# Patient Record
Sex: Female | Born: 1985 | Race: White | Hispanic: No | Smoking: Current every day smoker
Health system: Southern US, Community
[De-identification: ages and names within clinical notes are randomized; demographics above are authoritative.]

## PROBLEM LIST (undated history)

## (undated) ENCOUNTER — Inpatient Hospital Stay (HOSPITAL_COMMUNITY): Payer: Self-pay

## (undated) DIAGNOSIS — T4145XA Adverse effect of unspecified anesthetic, initial encounter: Secondary | ICD-10-CM

## (undated) DIAGNOSIS — IMO0002 Reserved for concepts with insufficient information to code with codable children: Secondary | ICD-10-CM

## (undated) DIAGNOSIS — R51 Headache: Secondary | ICD-10-CM

## (undated) HISTORY — PX: OTHER SURGICAL HISTORY: SHX169

---

## 2011-03-04 ENCOUNTER — Inpatient Hospital Stay (INDEPENDENT_AMBULATORY_CARE_PROVIDER_SITE_OTHER)
Admission: RE | Admit: 2011-03-04 | Discharge: 2011-03-04 | Disposition: A | Discharge status: Home / Self Care | Payer: Self-pay | Source: Ambulatory Visit | Attending: Family Medicine | Admitting: Family Medicine

## 2011-03-04 ENCOUNTER — Ambulatory Visit (INDEPENDENT_AMBULATORY_CARE_PROVIDER_SITE_OTHER): Payer: Self-pay

## 2011-03-04 DIAGNOSIS — M549 Dorsalgia, unspecified: Secondary | ICD-10-CM

## 2011-03-04 DIAGNOSIS — S139XXA Sprain of joints and ligaments of unspecified parts of neck, initial encounter: Secondary | ICD-10-CM

## 2012-03-26 ENCOUNTER — Encounter (HOSPITAL_COMMUNITY): Payer: Self-pay | Admitting: *Deleted

## 2012-03-26 ENCOUNTER — Inpatient Hospital Stay (HOSPITAL_COMMUNITY)
Admission: AD | Admit: 2012-03-26 | Discharge: 2012-03-26 | Disposition: A | Discharge status: Home / Self Care | Payer: Medicaid Other | Source: Ambulatory Visit | Attending: Obstetrics & Gynecology | Admitting: Obstetrics & Gynecology

## 2012-03-26 DIAGNOSIS — O219 Vomiting of pregnancy, unspecified: Secondary | ICD-10-CM

## 2012-03-26 DIAGNOSIS — O210 Mild hyperemesis gravidarum: Secondary | ICD-10-CM | POA: Insufficient documentation

## 2012-03-26 HISTORY — DX: Reserved for concepts with insufficient information to code with codable children: IMO0002

## 2012-03-26 HISTORY — DX: Headache: R51

## 2012-03-26 LAB — URINALYSIS, ROUTINE W REFLEX MICROSCOPIC
Glucose, UA: NEGATIVE mg/dL
Leukocytes, UA: NEGATIVE
Nitrite: NEGATIVE
Protein, ur: NEGATIVE mg/dL

## 2012-03-26 LAB — POCT PREGNANCY, URINE: Preg Test, Ur: POSITIVE — AB

## 2012-03-26 MED ORDER — PROMETHAZINE HCL 25 MG RE SUPP: 25.0000 mg | Freq: Four times a day (QID) | RECTAL | Status: DC | PRN

## 2012-03-26 MED ORDER — ONDANSETRON 8 MG PO TBDP
8.0000 mg | ORAL_TABLET | Freq: Once | ORAL | Status: AC
  Administered 2012-03-26: 8 mg via ORAL
  Filled 2012-03-26: qty 1

## 2012-03-26 MED ORDER — ACETAMINOPHEN 325 MG PO TABS
650.0000 mg | ORAL_TABLET | Freq: Once | ORAL | Status: AC
  Administered 2012-03-26: 650 mg via ORAL
  Filled 2012-03-26: qty 2

## 2012-03-26 MED ORDER — ONDANSETRON 8 MG PO TBDP: 8.0000 mg | ORAL_TABLET | Freq: Once | ORAL | Status: AC

## 2012-03-26 NOTE — MAU Note (Signed)
Pt also C/O migraine HA x 3-4 days.

## 2012-03-26 NOTE — MAU Provider Note (Signed)
History     CSN: [id number]  Arrival date and time: 03/26/12 4696   None     Chief Complaint  Patient presents with  . Emesis   HPI Alexis Duarte is 26 y.o. G3P1101 [redacted]w[redacted]d weeks presenting with symptoms of "excessive" nausea and vomiting.  Hx of morning sickness with previous pregnancies but sxs more than before.  Has appt to begin prenatal care with Dr. Arlyce Dice next week.  With the nausea/vomiting she has a headache off and on for 4 days.  Tylenol X 2, eases pain. Doesn't have medication at home for nausea.  Denies vaginal bleeding.  Having cramping on the left side.  Having loose stools since yesterday.  Began at the same time as vomiting.  No others at home are sick.  Patient states she had a stillborn at 8 months gestations and she is worried about this pregnancy.    Past Medical History  Diagnosis Date  . Headache   . Fetal demise     Past Surgical History  Procedure Date  . No past surgeries     History reviewed. No pertinent family history.  History  Substance Use Topics  . Smoking status: Current Everyday Smoker    Types: Cigarettes  . Smokeless tobacco: Not on file  . Alcohol Use: No    Allergies:  Allergies  Allergen Reactions  . Zithromax (Azithromycin) Nausea And Vomiting    Prescriptions prior to admission  Medication Sig Dispense Refill  . acetaminophen (TYLENOL) 500 MG tablet Take 1,000 mg by mouth every 8 (eight) hours as needed. For pain/headache      . calcium carbonate (TUMS - DOSED IN MG ELEMENTAL CALCIUM) 500 MG chewable tablet Chew 1 tablet by mouth daily as needed. For heartburn      . Prenatal Vit-Fe Fumarate-FA (PRENATAL MULTIVITAMIN) TABS Take 1 tablet by mouth every morning.        Review of Systems  Constitutional: Positive for fever (feels hot sometimes).  Gastrointestinal: Positive for nausea, vomiting, abdominal pain (mild cramping intermittent) and diarrhea. Negative for constipation.  Genitourinary: Negative for dysuria, urgency and  hematuria.       Negative for vaginal bleeding or abnormal discharge  Neurological: Positive for headaches.   Physical Exam   Blood pressure 113/92, pulse 95, temperature 97 F (36.1 C), temperature source Oral, resp. rate 16, last menstrual period 01/22/2012.  Physical Exam  Constitutional: She is oriented to person, place, and time. She appears well-developed and well-nourished. No distress.       Worried about the pregnancy  HENT:  Head: Normocephalic.  Neck: Normal range of motion.  Cardiovascular: Normal rate.   Respiratory: Effort normal.  GI: Soft. She exhibits no distension and no mass. There is no tenderness. There is no rebound and no guarding.  Genitourinary:       Not indicated  Neurological: She is alert and oriented to person, place, and time.  Skin: Skin is warm and dry.  Psychiatric: She has a normal mood and affect. Her behavior is normal.   Results for orders placed during the hospital encounter of 03/26/12 (from the past 24 hour(s))  URINALYSIS, ROUTINE W REFLEX MICROSCOPIC     Status: Normal   Collection Time   03/26/12  8:15 AM      Component Value Range   Color, Urine YELLOW  YELLOW   APPearance CLEAR  CLEAR   Specific Gravity, Urine 1.015  1.005 - 1.030   pH 7.0  5.0 - 8.0   Glucose,  UA NEGATIVE  NEGATIVE mg/dL   Hgb urine dipstick NEGATIVE  NEGATIVE   Bilirubin Urine NEGATIVE  NEGATIVE   Ketones, ur NEGATIVE  NEGATIVE mg/dL   Protein, ur NEGATIVE  NEGATIVE mg/dL   Urobilinogen, UA 0.2  0.0 - 1.0 mg/dL   Nitrite NEGATIVE  NEGATIVE   Leukocytes, UA NEGATIVE  NEGATIVE  POCT PREGNANCY, URINE     Status: Abnormal   Collection Time   03/26/12  8:19 AM      Component Value Range   Preg Test, Ur POSITIVE (*) NEGATIVE   MAU Course  Procedures   BEDSIDE ULTRASOUND:  + FHR estimated size 9 weeks. MDM Acetaminophen 650 mg po and Zofran 8mg  ODT subling.given  Discussed medications to be taken at home.  Patient would like phenergan for nighttime [place] and  Zofran for the day because she has to work at 5:30 am.  Need work note for today. Assessment and Plan  A:  Nausea and vomiting in early pregnancy      Viable embryo by bedside ultrasound  P:  Rx for Phenergan 25mg  suppoistories for hs use       Rx for Zofran 8mg  OTC for day time use     Keep appointment with Dr. Arlyce Dice to begin prenatal care.  KEY,EVE M 03/26/2012, 8:57 AM

## 2012-03-26 NOTE — MAU Note (Signed)
Pt C/O excessive vomitting for past 2 days, chills, diarrhea also x 3 days.  LLQ cramping for last 2-3 days, denies vaginal bleeding.

## 2012-04-03 LAB — OB RESULTS CONSOLE HEPATITIS B SURFACE ANTIGEN: Hepatitis B Surface Ag: NEGATIVE

## 2012-04-03 LAB — OB RESULTS CONSOLE HIV ANTIBODY (ROUTINE TESTING): HIV: NONREACTIVE

## 2012-04-23 LAB — OB RESULTS CONSOLE RUBELLA ANTIBODY, IGM: Rubella: IMMUNE

## 2012-06-02 ENCOUNTER — Encounter (HOSPITAL_COMMUNITY): Payer: Self-pay | Admitting: Obstetrics and Gynecology

## 2012-06-02 ENCOUNTER — Inpatient Hospital Stay (HOSPITAL_COMMUNITY)
Admission: AD | Admit: 2012-06-02 | Discharge: 2012-06-02 | Disposition: A | Discharge status: Home / Self Care | Payer: Medicaid Other | Source: Ambulatory Visit | Attending: Obstetrics and Gynecology | Admitting: Obstetrics and Gynecology

## 2012-06-02 DIAGNOSIS — R42 Dizziness and giddiness: Secondary | ICD-10-CM

## 2012-06-02 DIAGNOSIS — O99891 Other specified diseases and conditions complicating pregnancy: Secondary | ICD-10-CM | POA: Insufficient documentation

## 2012-06-02 DIAGNOSIS — Z3490 Encounter for supervision of normal pregnancy, unspecified, unspecified trimester: Secondary | ICD-10-CM

## 2012-06-02 LAB — COMPREHENSIVE METABOLIC PANEL
ALT: 12 U/L (ref 0–35)
AST: 13 U/L (ref 0–37)
Albumin: 3.4 g/dL — ABNORMAL LOW (ref 3.5–5.2)
Alkaline Phosphatase: 43 U/L (ref 39–117)
Calcium: 9.3 mg/dL (ref 8.4–10.5)
Potassium: 4.1 mEq/L (ref 3.5–5.1)
Sodium: 135 mEq/L (ref 135–145)
Total Protein: 7 g/dL (ref 6.0–8.3)

## 2012-06-02 LAB — URINALYSIS, ROUTINE W REFLEX MICROSCOPIC
Bilirubin Urine: NEGATIVE
Glucose, UA: NEGATIVE mg/dL
Ketones, ur: NEGATIVE mg/dL
Specific Gravity, Urine: <1.005 — ABNORMAL LOW (ref 1.005–1.030)
pH: 7 (ref 5.0–8.0)

## 2012-06-02 LAB — CBC
Hemoglobin: 11.3 g/dL — ABNORMAL LOW (ref 12.0–15.0)
MCHC: 33.9 g/dL (ref 30.0–36.0)
Platelets: 202 10*3/uL (ref 150–400)
RDW: 12.6 % (ref 11.5–15.5)

## 2012-06-02 NOTE — MAU Provider Note (Signed)
History     CSN: [id number]  Arrival date and time: 06/02/12 6213   First Provider Initiated Contact with Patient 06/02/12 [phone]      Chief Complaint  Patient presents with  . Dizziness  . Tinnitus   HPI 26 y.o. G3P1101 at [redacted]w[redacted]d with dizziness and ringing in ears intermittently since Thursday. Happened around 6:45 this morning. Feels hot, ears ring, legs feel week, feels "like my head's full of cotton" afterwards, headache (2/10). Had apple cinnamon oatmeal and juice this morning. No syncopal episodes.    Past Medical History  Diagnosis Date  . Headache   . Fetal demise     Past Surgical History  Procedure Date  . Stitches to head as a child     History reviewed. No pertinent family history.  History  Substance Use Topics  . Smoking status: Current Every Day Smoker    Types: Cigarettes  . Smokeless tobacco: Not on file  . Alcohol Use: No    Allergies:  Allergies  Allergen Reactions  . Zithromax (Azithromycin) Nausea And Vomiting    Prescriptions prior to admission  Medication Sig Dispense Refill  . acetaminophen (TYLENOL) 500 MG tablet Take 1,000 mg by mouth every 8 (eight) hours as needed. For pain/headache      . calcium carbonate (TUMS - DOSED IN MG ELEMENTAL CALCIUM) 500 MG chewable tablet Chew 1 tablet by mouth daily as needed. For heartburn      . ondansetron (ZOFRAN-ODT) 8 MG disintegrating tablet Take 8 mg by mouth every 8 (eight) hours as needed. nausea      . Prenatal Vit-Fe Fumarate-FA (PRENATAL MULTIVITAMIN) TABS Take 1 tablet by mouth at bedtime.         Review of Systems  Respiratory: Negative.   Cardiovascular: Negative.   Gastrointestinal: Negative for nausea, vomiting, abdominal pain, diarrhea and constipation.  Genitourinary: Negative for dysuria, urgency, frequency, hematuria and flank pain.       Negative for vaginal bleeding, cramping/contractions  Musculoskeletal: Negative.   Neurological: Positive for dizziness, weakness and  headaches.  Psychiatric/Behavioral: Negative.    Physical Exam   Blood pressure 123/88, pulse 93, temperature 98 F (36.7 C), temperature source Oral, resp. rate 18, height 5' 3.5" (1.613 m), weight 131 lb 3.2 oz (59.512 kg), last menstrual period 01/22/2012.  Physical Exam  Nursing note and vitals reviewed. Constitutional: She is oriented to person, place, and time. She appears well-developed and well-nourished. No distress.  HENT:  Head: Normocephalic and atraumatic.  Right Ear: Tympanic membrane and external ear normal.  Left Ear: Tympanic membrane and external ear normal.  Eyes: Conjunctivae normal and EOM are normal. Pupils are equal, round, and reactive to light.  Cardiovascular: Normal rate, regular rhythm and normal heart sounds.   Respiratory: Effort normal and breath sounds normal. No respiratory distress.  GI: Soft. There is no tenderness.  Musculoskeletal: Normal range of motion.  Neurological: She is alert and oriented to person, place, and time. No cranial nerve deficit. Coordination normal.  Skin: Skin is warm and dry.  Psychiatric: She has a normal mood and affect. Her behavior is normal.    MAU Course  Procedures Results for orders placed during the hospital encounter of 06/02/12 (from the past 24 hour(s))  URINALYSIS, ROUTINE W REFLEX MICROSCOPIC     Status: Abnormal   Collection Time   06/02/12  9:05 AM      Component Value Range   Color, Urine YELLOW  YELLOW   APPearance CLEAR  CLEAR  Specific Gravity, Urine <1.005 (*) 1.005 - 1.030   pH 7.0  5.0 - 8.0   Glucose, UA NEGATIVE  NEGATIVE mg/dL   Hgb urine dipstick NEGATIVE  NEGATIVE   Bilirubin Urine NEGATIVE  NEGATIVE   Ketones, ur NEGATIVE  NEGATIVE mg/dL   Protein, ur NEGATIVE  NEGATIVE mg/dL   Urobilinogen, UA 0.2  0.0 - 1.0 mg/dL   Nitrite NEGATIVE  NEGATIVE   Leukocytes, UA NEGATIVE  NEGATIVE  CBC     Status: Abnormal   Collection Time   06/02/12  9:20 AM      Component Value Range   WBC 10.3   4.0 - 10.5 K/uL   RBC 3.62 (*) 3.87 - 5.11 MIL/uL   Hemoglobin 11.3 (*) 12.0 - 15.0 g/dL   HCT 16.1 (*) 09.6 - 04.5 %   MCV 92.0  78.0 - 100.0 fL   MCH 31.2  26.0 - 34.0 pg   MCHC 33.9  30.0 - 36.0 g/dL   RDW 40.9  81.1 - 91.4 %   Platelets 202  150 - 400 K/uL  COMPREHENSIVE METABOLIC PANEL     Status: Abnormal   Collection Time   06/02/12  9:20 AM      Component Value Range   Sodium 135  135 - 145 mEq/L   Potassium 4.1  3.5 - 5.1 mEq/L   Chloride 101  96 - 112 mEq/L   CO2 24  19 - 32 mEq/L   Glucose, Bld 80  70 - 99 mg/dL   BUN 5 (*) 6 - 23 mg/dL   Creatinine, Ser 7.82 (*) 0.50 - 1.10 mg/dL   Calcium 9.3  8.4 - 95.6 mg/dL   Total Protein 7.0  6.0 - 8.3 g/dL   Albumin 3.4 (*) 3.5 - 5.2 g/dL   AST 13  0 - 37 U/L   ALT 12  0 - 35 U/L   Alkaline Phosphatase 43  39 - 117 U/L   Total Bilirubin 0.3  0.3 - 1.2 mg/dL   GFR calc non Af Amer >90  >90 mL/min   GFR calc Af Amer >90  >90 mL/min     Assessment and Plan   1. Episodic lightheadedness   2. Supervision of normal pregnancy    Increase frequency of meals, protein, decrease sugar F/U in office if symptoms worsen or continue despite lifestyle changes    Medication List     As of 06/02/2012  3:18 PM    CONTINUE taking these medications         acetaminophen 500 MG tablet   Commonly known as: TYLENOL      calcium carbonate 500 MG chewable tablet   Commonly known as: TUMS - dosed in mg elemental calcium      ondansetron 8 MG disintegrating tablet   Commonly known as: ZOFRAN-ODT      prenatal multivitamin Tabs            Follow-up Information    Follow up with Mickel Baas, MD. (as scheduled or sooner as needed)    Contact information:   [address] [place] [address] [phone]            FRAZIER,NATALIE 06/02/2012, 9:32 AM

## 2012-06-02 NOTE — MAU Note (Signed)
"  Thursday I was at work and I thought I got to hot.  My ears started ringing, then dizzy x 2 episodes.  My head feels congested and I can't concentrate.  No vomiting.  It happened again this morning about 0645.  I felt dizzy and lightheaded and fell backwards on my butt.  It happened once again and I got sweaty.  I laid down and caught my breath."

## 2012-07-13 ENCOUNTER — Inpatient Hospital Stay (HOSPITAL_COMMUNITY): Payer: Medicaid Other

## 2012-07-13 ENCOUNTER — Encounter (HOSPITAL_COMMUNITY): Payer: Self-pay | Admitting: Anesthesiology

## 2012-07-13 ENCOUNTER — Inpatient Hospital Stay (HOSPITAL_COMMUNITY)
Admission: AD | Admit: 2012-07-13 | Discharge: 2012-07-18 | DRG: 782 | Discharge status: Against Medical Advice | Payer: Medicaid Other | Source: Ambulatory Visit | Attending: Obstetrics and Gynecology | Admitting: Obstetrics and Gynecology

## 2012-07-13 ENCOUNTER — Encounter (HOSPITAL_COMMUNITY): Payer: Self-pay | Admitting: *Deleted

## 2012-07-13 DIAGNOSIS — T8859XA Other complications of anesthesia, initial encounter: Secondary | ICD-10-CM

## 2012-07-13 DIAGNOSIS — O4590 Premature separation of placenta, unspecified, unspecified trimester: Principal | ICD-10-CM | POA: Diagnosis present

## 2012-07-13 DIAGNOSIS — R109 Unspecified abdominal pain: Secondary | ICD-10-CM | POA: Diagnosis present

## 2012-07-13 HISTORY — DX: Other complications of anesthesia, initial encounter: T88.59XA

## 2012-07-13 HISTORY — DX: Adverse effect of unspecified anesthetic, initial encounter: T41.45XA

## 2012-07-13 LAB — COMPREHENSIVE METABOLIC PANEL
ALT: 9 U/L (ref 0–35)
Alkaline Phosphatase: 56 U/L (ref 39–117)
BUN: 8 mg/dL (ref 6–23)
CO2: 22 mEq/L (ref 19–32)
Calcium: 9.8 mg/dL (ref 8.4–10.5)
GFR calc Af Amer: >90 mL/min (ref >90)
GFR calc non Af Amer: >90 mL/min (ref >90)
Glucose, Bld: 87 mg/dL (ref 70–99)
Potassium: 3.3 mEq/L — ABNORMAL LOW (ref 3.5–5.1)
Total Protein: 7 g/dL (ref 6.0–8.3)

## 2012-07-13 LAB — ABO/RH: ABO/RH(D): O POS

## 2012-07-13 LAB — CBC
HCT: 31 % — ABNORMAL LOW (ref 36.0–46.0)
Hemoglobin: 11 g/dL — ABNORMAL LOW (ref 12.0–15.0)
MCH: 32.5 pg (ref 26.0–34.0)
MCH: 31.4 pg (ref 26.0–34.0)
MCHC: 35.5 g/dL (ref 30.0–36.0)
MCHC: 33.8 g/dL (ref 30.0–36.0)
Platelets: 229 10*3/uL (ref 150–400)
RBC: 3.38 MIL/uL — ABNORMAL LOW (ref 3.87–5.11)

## 2012-07-13 LAB — PROTIME-INR: INR: 0.91 (ref 0.00–1.49)

## 2012-07-13 LAB — APTT: aPTT: 28 seconds (ref 24–37)

## 2012-07-13 LAB — PREPARE RBC (CROSSMATCH)

## 2012-07-13 MED ORDER — BETAMETHASONE SOD PHOS & ACET 6 (3-3) MG/ML IJ SUSP
12.0000 mg | INTRAMUSCULAR | Status: AC
  Administered 2012-07-13 – 2012-07-14 (×2): 12 mg via INTRAMUSCULAR
  Filled 2012-07-13 (×2): qty 2

## 2012-07-13 MED ORDER — MAGNESIUM SULFATE 40 G IN LACTATED RINGERS - SIMPLE: 2.0000 g/h | INTRAVENOUS | Status: DC

## 2012-07-13 MED ORDER — PRENATAL MULTIVITAMIN CH
1.0000 | ORAL_TABLET | Freq: Every day | ORAL | Status: DC
  Administered 2012-07-14: 1 via ORAL
  Filled 2012-07-13: qty 1

## 2012-07-13 MED ORDER — MAGNESIUM SULFATE 40 G IN LACTATED RINGERS - SIMPLE
2.0000 g/h | INTRAVENOUS | Status: DC
  Administered 2012-07-13 – 2012-07-15 (×3): 2 g/h via INTRAVENOUS
  Filled 2012-07-13 (×3): qty 500

## 2012-07-13 MED ORDER — ACETAMINOPHEN 325 MG PO TABS
650.0000 mg | ORAL_TABLET | ORAL | Status: DC | PRN
  Administered 2012-07-15 – 2012-07-17 (×3): 650 mg via ORAL
  Filled 2012-07-13: qty 1
  Filled 2012-07-13: qty 2
  Filled 2012-07-13: qty 1
  Filled 2012-07-13: qty 2

## 2012-07-13 MED ORDER — MAGNESIUM SULFATE BOLUS VIA INFUSION: 4.0000 g | Freq: Once | INTRAVENOUS | Status: DC

## 2012-07-13 MED ORDER — ZOLPIDEM TARTRATE 5 MG PO TABS: 5.0000 mg | ORAL_TABLET | Freq: Every evening | ORAL | Status: DC | PRN

## 2012-07-13 MED ORDER — CALCIUM CARBONATE ANTACID 500 MG PO CHEW: 2.0000 | CHEWABLE_TABLET | ORAL | Status: DC | PRN

## 2012-07-13 MED ORDER — MAGNESIUM SULFATE BOLUS VIA INFUSION
4.0000 g | Freq: Once | INTRAVENOUS | Status: AC
  Administered 2012-07-13: 4 g via INTRAVENOUS
  Filled 2012-07-13: qty 500

## 2012-07-13 MED ORDER — LACTATED RINGERS IV SOLN
INTRAVENOUS | Status: DC
  Administered 2012-07-13 – 2012-07-15 (×5): via INTRAVENOUS

## 2012-07-13 MED ORDER — MAGNESIUM SULFATE 40 G IN LACTATED RINGERS - SIMPLE: 2.0000 g/h | Freq: Once | INTRAVENOUS | Status: DC

## 2012-07-13 MED ORDER — DOCUSATE SODIUM 100 MG PO CAPS
100.0000 mg | ORAL_CAPSULE | Freq: Every day | ORAL | Status: DC
  Administered 2012-07-14 – 2012-07-16 (×3): 100 mg via ORAL
  Filled 2012-07-13 (×4): qty 1

## 2012-07-13 MED ORDER — SODIUM CHLORIDE 0.9 % IV SOLN
INTRAVENOUS | Status: DC
  Administered 2012-07-13: 17:00:00 via INTRAVENOUS

## 2012-07-13 NOTE — Progress Notes (Signed)
uc's q 2-3 minutes until 1813, now appears to be ui.

## 2012-07-13 NOTE — H&P (Addendum)
Alexis Duarte is a 26 y.o. female presenting for cramping and bleeding  26yo G3P1101 @ 23+3 presents to MAU c/o heavy vaginal bleeding and cramping. The patient earlier today tripped and fell at work around 1pm.  She landed on her bottom and had no direct abdominal trauma. At 3pm she noted a gush of fluid and went to the bathroom and found bright red bleeding.  She soaked a pad en route and continued to have bright bleeding in MAU.  A bedside ultrasound was performed and demonstrated a large retroplacental abruption measuring 10 x 5 x 7 cm.  FHTs 150s and reactive for 23 weeks with only rare variables and no decelerations.  Cervix FT. Pt is contracting every 2-3 minutes but is not in severe pain.   The patient has a h/o of a prior pregnancy affected by placental abruption with a still birth in 2007 @ 36-37 weeks.  After this she had a full term SVD of a female infant.  This pregnancy has been complicated by tobacco abuse.  History OB History    Grav Para Term Preterm Abortions TAB SAB Ect Mult Living   3 2 1 1      1      Past Medical History  Diagnosis Date  . Headache   . Fetal demise    Past Surgical History  Procedure Date  . Stitches to head as a child    Family History: family history is not on file. Social History:  reports that she has been smoking Cigarettes.  She does not have any smokeless tobacco history on file. She reports that she does not drink alcohol or use illicit drugs.   Prenatal Transfer Tool  Maternal Diabetes: Glucola not yet done Genetic Screening: Normal Maternal Ultrasounds/Referrals: Normal Fetal Ultrasounds or other Referrals:  Fetal echo Maternal Substance Abuse:  Yes:  Type: Smoker Significant Maternal Medications:  None Significant Maternal Lab Results:  None Other Comments:  None  ROS: as above  Dilation: Fingertip Effacement (%): Thick Station: -2 Exam by:: Ivonne Andrew CNM Blood pressure 115/77, pulse 90, temperature 98 F (36.7 C),  temperature source Oral, resp. rate 18, height 5\' 3"  (1.6 m), weight 61.236 kg (135 lb), last menstrual period 01/22/2012. Exam Physical Exam  Prenatal labs: ABO, Rh: --/--/O POS, O POS (12/06 1655) Antibody: NEG (12/06 1655) Rubella:   RPR:   NR HBsAg:   Neg HIV:   NR GBS:   Not done  TAUS: Anterior placenta, 10.7 x 5 x 7 cm retroplacental clot.  Fetus cephalic. Cervical length 3.6cm  Assessment/Plan: 26 yo G3P1101 @ 23+3 with a partial placental abruption 1) Admit 2) Strict bedrest, place foley catheter 3) Dorathy Kinsman, CNM discussed pts case with Dr. Claudean Severance from MFM.  He recommended giving betamethasone for FLM.  He felt tocolysis was a judgement call.  Given the gestational age and the reassuring fetal tracing will proceed with magnesium tocolysis to try to get patient through steroid time.  4) Clear liquids only for now. 5) CBC Q 6 hrs x 24 hrs. Check coags with next blood draw 6) T&S, KB pending 7) SCDs for DVT prophylaxis   Jahkeem Kurka H. 07/13/2012, 7:03 PM

## 2012-07-13 NOTE — Consult Note (Addendum)
Neonatology Consult Note: At the request of the patients obstetrician Dr. Tenny Craw I met with Alexis Duarte emergently, who presents at 23 3 wks (dated by a 9 week [place]) with abruption.  Receiving BMZ at the time of this consult.  Delivery plan ongoing.      We discussed morbidity/mortality at this gestional age (25% survival rate at this gestational age nationally), delivery room resuscitation, including intubation and surfactant in DR.  Discussed mechanical ventilation and risk for chronic lung disease, risk for IVH with potential for motor / cognitive deficits, ROP, NEC, sepsis, as well as temperature instability and feeding immaturity.  Discussed NG / OG feeds, benefits of MBM in reducing incidence of NEC.   Discussed likely length of stay. Parents are quite distressed at this time however indicate that they would like all resuscitative efforts to be made and  understand that the baby will be very sick with a significant mortality risk.      Thank you for allowing [place] to participate in her care.  Please call with questions. Face to face time 20 min.  Alexis Giovanni, DO  Neonatologist

## 2012-07-13 NOTE — MAU Note (Signed)
Patient states she fell at 1245 on her hands and knees but did not hit her abdomen. About 1500 she started bleeding. Pad patient is wearing on arrival is 3/4 saturated with dark red blood and patient is in a lot of pain with lower abdominal pain. States she has felt the baby move.

## 2012-07-13 NOTE — MAU Provider Note (Signed)
Chief Complaint:  Abdominal Pain, Vaginal Bleeding and Fall   First Provider Initiated Contact with Patient 07/13/12 1751     HPI: Alexis Duarte is a 26 y.o. G3P1101 at [redacted]w[redacted]d who presents to maternity admissions reporting minor fall onto knees w/out abd contact at 1245 and onset of ?contractions and moderate bright red bleeding at 1500. Unsure if she is feeling fetal mvmt. Hx of abruption and stillbirth w/ G1  Past Medical History: Past Medical History  Diagnosis Date  . Headache   . Fetal demise     Past obstetric history: OB History    Grav Para Term Preterm Abortions TAB SAB Ect Mult Living   3 2 1 1      1      # Outc Date GA Lbr Len/2nd Wgt Sex Del Anes PTL Lv   1 PRE 2/07 [redacted]w[redacted]d   M SVD   SB   2 TRM 12/07 [redacted]w[redacted]d   F    Yes   3 CUR               Past Surgical History: Past Surgical History  Procedure Date  . Stitches to head as a child     Family History: No family history on file.  Social History: History  Substance Use Topics  . Smoking status: Current Every Day Smoker    Types: Cigarettes  . Smokeless tobacco: Not on file  . Alcohol Use: No    Allergies:  Allergies  Allergen Reactions  . Zithromax (Azithromycin) Nausea And Vomiting    Meds:  Prescriptions prior to admission  Medication Sig Dispense Refill  . acetaminophen (TYLENOL) 500 MG tablet Take 1,000 mg by mouth every 8 (eight) hours as needed. For pain/headache      . ondansetron (ZOFRAN-ODT) 8 MG disintegrating tablet Take 8 mg by mouth every 8 (eight) hours as needed. nausea      . Prenatal Vit-Fe Fumarate-FA (PRENATAL MULTIVITAMIN) TABS Take 1 tablet by mouth at bedtime.         ROS: Pertinent findings in history of present illness.  Physical Exam  Blood pressure 125/77, pulse 102, temperature 97.2 F (36.2 C), temperature source Oral, resp. rate 22, last menstrual period 01/22/2012. GENERAL: Well-developed, well-nourished female in severe distress.  HEENT: normocephalic HEART: mild  tachycardia RESP: normal effort ABDOMEN: Soft, non-tender,  gravid appropriate for gestational age. Uterus rigid.  EXTREMITIES: Nontender, no edema NEURO: alert and oriented SPECULUM EXAM: deferred. Small amount of DRB on pad. Moderate DRB on glove after cervical exam. Dilation: Fingertip Effacement (%): Thick Cervical Position: Anterior Station: -2 Exam by:: Ivonne Andrew CNM  FHT:  Baseline 160 , moderate variability, no accelerations present, mild variable decelerations Contractions: q 2-3 mins, mild   Labs: Results for orders placed during the hospital encounter of 07/13/12 (from the past 24 hour(s))  CBC     Status: Abnormal   Collection Time   07/13/12  4:55 PM      Component Value Range   WBC 13.9 (*) 4.0 - 10.5 K/uL   RBC 3.38 (*) 3.87 - 5.11 MIL/uL   Hemoglobin 11.0 (*) 12.0 - 15.0 g/dL   HCT 96.0 (*) 45.4 - 09.8 %   MCV 91.7  78.0 - 100.0 fL   MCH 32.5  26.0 - 34.0 pg   MCHC 35.5  30.0 - 36.0 g/dL   RDW 11.9  14.7 - 82.9 %   Platelets 230  150 - 400 K/uL  COMPREHENSIVE METABOLIC PANEL     Status:  Abnormal   Collection Time   07/13/12  4:55 PM      Component Value Range   Sodium 134 (*) 135 - 145 mEq/L   Potassium 3.3 (*) 3.5 - 5.1 mEq/L   Chloride 100  96 - 112 mEq/L   CO2 22  19 - 32 mEq/L   Glucose, Bld 87  70 - 99 mg/dL   BUN 8  6 - 23 mg/dL   Creatinine, Ser 4.01 (*) 0.50 - 1.10 mg/dL   Calcium 9.8  8.4 - 02.7 mg/dL   Total Protein 7.0  6.0 - 8.3 g/dL   Albumin 3.1 (*) 3.5 - 5.2 g/dL   AST 11  0 - 37 U/L   ALT 9  0 - 35 U/L   Alkaline Phosphatase 56  39 - 117 U/L   Total Bilirubin 0.3  0.3 - 1.2 mg/dL   GFR calc non Af Amer >90  >90 mL/min   GFR calc Af Amer >90  >90 mL/min  TYPE AND SCREEN     Status: Normal   Collection Time   07/13/12  4:55 PM      Component Value Range   ABO/RH(D) O POS     Antibody Screen NEG     Sample Expiration 07/16/2012    ABO/RH     Status: Normal   Collection Time   07/13/12  4:55 PM      Component Value Range   ABO/RH(D)  O POS    PREPARE RBC (CROSSMATCH)     Status: Normal   Collection Time   07/13/12  5:30 PM      Component Value Range   Order Confirmation ORDER PROCESSED BY BLOOD BANK      Imaging:    MAU Course: 1700: Notified Dr. Tenny Craw of pt Sx highly suspicious for abruption. Sonographer at [facility]. Dr. Tenny Craw in OR. Asked to consult MFM RE: administration of BMZ and Mag. Dr. Arville Lime recommends BMZ, Mag is up to provider, only 12 hours if given.   1749: Neonatologist at [facility]. BMZ #1 given. 1800: Dr. Tenny Craw at [facility] discussing POC. Assuming care of pt.   Assessment: Placental abruption  Plan: Admit to Antenatal.  Dorathy Kinsman, CNM 07/13/2012 5:23 PM

## 2012-07-14 LAB — CBC
Hemoglobin: 11 g/dL — ABNORMAL LOW (ref 12.0–15.0)
MCH: 31.9 pg (ref 26.0–34.0)
MCHC: 34.7 g/dL (ref 30.0–36.0)
MCV: 91.9 fL (ref 78.0–100.0)
Platelets: 245 10*3/uL (ref 150–400)
Platelets: 228 10*3/uL (ref 150–400)
RDW: 12.6 % (ref 11.5–15.5)
WBC: 14.5 10*3/uL — ABNORMAL HIGH (ref 4.0–10.5)

## 2012-07-14 MED ORDER — ONDANSETRON HCL 4 MG/2ML IJ SOLN
4.0000 mg | Freq: Four times a day (QID) | INTRAMUSCULAR | Status: DC | PRN
  Administered 2012-07-14 – 2012-07-18 (×4): 4 mg via INTRAVENOUS
  Filled 2012-07-14 (×4): qty 2

## 2012-07-14 MED ORDER — COMPLETENATE 29-1 MG PO CHEW
1.0000 | CHEWABLE_TABLET | Freq: Every day | ORAL | Status: DC
  Administered 2012-07-15 – 2012-07-18 (×4): 1 via ORAL
  Filled 2012-07-14 (×5): qty 1

## 2012-07-14 NOTE — Progress Notes (Signed)
1300- foley catheter d/c without difficulty. of clear yellow urine emptied.

## 2012-07-14 NOTE — Progress Notes (Signed)
Patient ID: Marlene Bast, female   DOB: 04/28/1986, 26 y.o.   MRN: [id number]   S: Denies feeling significant contractions. Only spotting of old blood on pad.  Uncomfortable with foley catheter. O: Filed Vitals:   07/14/12 1102 07/14/12 1157 07/14/12 1158 07/14/12 1301  BP:   110/74   Pulse:   86   Temp:  97.7 F (36.5 C)    TempSrc:  Oral    Resp: 20 20  20   Height:      Weight:       AOx3 NAD Soft NT ND FHT 150 reactive for 23 weeks with accelerations no decelerations. Cvx deferred toco irritability  CBC    Component Value Date/Time   WBC 16.3* 07/14/2012 1004   RBC 3.45* 07/14/2012 1004   HGB 11.0* 07/14/2012 1004   HCT 31.7* 07/14/2012 1004   PLT 228 07/14/2012 1004   MCV 91.9 07/14/2012 1004   MCH 31.9 07/14/2012 1004   MCHC 34.7 07/14/2012 1004   RDW 12.7 07/14/2012 1004    Coags WNL  A/P:  23+4 with large partial placental abruption 1) Cont Bedrest 2) Cont Steroids, steroid time complete 5pm tomorrow.  Continue Magnesium through steroid time 3) Continuous monitoring

## 2012-07-15 NOTE — Progress Notes (Addendum)
Patient ID: Alexis Duarte, female   DOB: 10-17-1985, 26 y.o.   MRN: [id number]  S: No changes, spotting on pad and with wiping.  Had an episode of vomiting this morning and has had more bright red spotting since then. OCeasar Mons Vitals:   07/15/12 0805 07/15/12 0902 07/15/12 0950 07/15/12 1106  BP: 105/74     Pulse: 82     Temp:      TempSrc:      Resp: 18 18 18 18   Height:      Weight:       AOx3 Soft NT FHT 140-150 reactive for 23 weeks.  No decels toco quiet  A/P 1) Continue strict bedrest.  May be able to advance to bathroom privleges in am.   2) Steroid time complete at 7pm.  Will stop magnesium at that point.

## 2012-07-16 ENCOUNTER — Encounter (HOSPITAL_COMMUNITY): Payer: Self-pay | Admitting: *Deleted

## 2012-07-16 NOTE — Consult Note (Signed)
[facility]  Neonatal Medicine Consultation       07/16/2012    12:39 PM  I was called at the request of the patient's nurse to update this patient and her husband.  Dr. Algernon Huxley spoke with them a couple of days ago.  I answered their questions as best as I could.  She remains between 23 and 24 weeks, but has improved since admission.  Mom says that she may be discharged in the next day or two.  _____________________ Electronically Signed By: Angelita Ingles, MD Neonatologist

## 2012-07-16 NOTE — Progress Notes (Signed)
Stable s/p partial abruption.  Bathroom privileges today.

## 2012-07-16 NOTE — Progress Notes (Signed)
CSW provided pt's RN with contact information to the [facility] [employer], as options for attorneys.  CSW signing off at this time.  Please reconsult if needed.

## 2012-07-17 LAB — TYPE AND SCREEN
ABO/RH(D): O POS
Unit division: 0

## 2012-07-17 MED ORDER — NICOTINE POLACRILEX 2 MG MT GUM
2.0000 mg | CHEWING_GUM | OROMUCOSAL | Status: DC | PRN
  Administered 2012-07-17: 2 mg via ORAL
  Filled 2012-07-17 (×2): qty 1

## 2012-07-17 NOTE — Progress Notes (Signed)
Pt crying and upset begging to go outside and stated that she wants to go now MD called and left message no response enc pt not to smoke  Knew pt would when got outside explained to pt the importance of not smoking

## 2012-07-17 NOTE — Progress Notes (Signed)
UR completed 

## 2012-07-17 NOTE — Progress Notes (Signed)
Pt seen this morning reported no bleeding.  No pain/ctx.  Good fetal movement. No other complaints. FHT: 150 with 10x10s TOCO: quiet SVE deferred A/P: continued monitoring, current mgmt  Revisited pt at 12:30 after speaking with nurse.  RN states patient demanded wheelchair ride outside to smoke.  RN discussed with pt the risks to pregnancy associated with smoking and offered nicotine patch.  Pt refused nicotine patch and proceeded outside to smoke.  I personally discussed with patient at length the increased risks of abruption and fetal death with smoking and well as other possible complications.  Pt stated that she will not use a patch because it irritates her skin.  I offered nicotine gum instead and patient agrees to try.  I stated patient is not allowed to go outside for smoking.  Pt states she understands.

## 2012-07-18 NOTE — Progress Notes (Signed)
Patient ID: Alexis Duarte, female   DOB: 12-11-1985, 26 y.o.   MRN: [id number]   S: Pt very tearful and anxious.  Wants to go home.  Feeling very anxious about sitting still.  Very concerned about daughter at home.  Having spotting.  Plans to sign out AMA this evening O:  Filed Vitals:   07/17/12 2140 07/17/12 2200 07/17/12 2300 07/18/12 0732  BP:    114/78  Pulse:    97  Temp:    98.2 F (36.8 C)  TempSrc:    Oral  Resp: 18 18 18 18   Height:      Weight:       AOx3, NAD Gravid, soft, NT FHT 150 toco irritability Cvx def  A/P 1) Pt plans to sign out AMA.  Advised of risk, worsening bleeding, PTL, Fetal death.  Pt understands these risks. 2) F/U appt scheduled in office 12/20 @2 :30 for ultrasound and MD visit

## 2012-07-18 NOTE — Progress Notes (Signed)
Pt signed out AMA. Pt left via wheelchair.

## 2012-07-23 NOTE — Discharge Summary (Signed)
Obstetric Discharge Summary Reason for Admission: Vaginal bleeding Prenatal Procedures: Ultrasound Intrapartum Procedures: Magnesium Sulfate tocolysis and for Neuroprotection, Antenatal Steroids, NICU Consultation, Ultrasound Postpartum Procedures: N/A, undelivered Complications-Operative and Postpartum: N/A, undelivered Hemoglobin  Date Value Range Status  07/14/2012 11.0* 12.0 - 15.0 g/dL Final     HCT  Date Value Range Status  07/14/2012 31.7* 36.0 - 46.0 % Final    Physical Exam:  General: AOx3 Gravid soft NT Cvx deferred FHT 140-150 reactive for 24 weeks toco quiet  Discharge Diagnoses: 24+1 week intrauterine pregnancy, partial placental abruption.  The patient signed out Against Medical Advise  Discharge Information: Date: 07/23/2012 Activity: Bedrest with bathroom privledges Diet: Regular Medications: PNV Condition: Fair Instructions: Return to hospital ASAP if bleeding increases. She left AMA and understood that the risks of leaving included but were not limited to recurrent bleeding, preterm labor, preterm delivery, intrauterine fetal demise, maternal hemorrhage.   Discharge to: Pt left Against Medical Advise    Alexis Duarte. 07/23/2012, 8:48 PM

## 2012-08-08 NOTE — L&D Delivery Note (Signed)
Delivery Note At 6:57 PM a viable and healthy female was delivered via Vaginal, Spontaneous Delivery (Presentation: Middle Occiput Anterior).  APGAR: , ; weight pending.   Placenta status: Intact, Spontaneous.  Cord 3V with a nuchal cord x 1.    Patient progressed from 7 to complete rapidly.  The infants head was delivered to crowning. A nuchal cord was noted but could not be reduced prior to delivery of the body.  The cord was clamped and cut and passed to the waiting Neonatologist and isolette. The placenta delivered spontaneously and intact and the will be sent to pathology.  No lacerations required repair.  Mother doing well.  The baby is transferred to NICU.  Anesthesia: Epidural  Episiotomy: None Lacerations: None Suture Repair: none Est. Blood Loss (mL): 250  Mom to postpartum.  Baby to nursery-stable.  Nixie Laube H. 09/28/2012, 7:10 PM

## 2012-08-14 ENCOUNTER — Other Ambulatory Visit (HOSPITAL_COMMUNITY): Payer: Self-pay | Admitting: Obstetrics and Gynecology

## 2012-08-14 DIAGNOSIS — O4590 Premature separation of placenta, unspecified, unspecified trimester: Secondary | ICD-10-CM

## 2012-08-15 ENCOUNTER — Ambulatory Visit (HOSPITAL_COMMUNITY): Payer: Medicaid Other | Attending: Obstetrics and Gynecology

## 2012-09-07 ENCOUNTER — Inpatient Hospital Stay (HOSPITAL_COMMUNITY): Payer: Medicaid Other

## 2012-09-07 ENCOUNTER — Inpatient Hospital Stay (HOSPITAL_COMMUNITY)
Admission: AD | Admit: 2012-09-07 | Discharge: 2012-09-07 | Disposition: A | Discharge status: Home / Self Care | Payer: Medicaid Other | Source: Ambulatory Visit | Attending: Obstetrics and Gynecology | Admitting: Obstetrics and Gynecology

## 2012-09-07 ENCOUNTER — Encounter (HOSPITAL_COMMUNITY): Payer: Self-pay

## 2012-09-07 DIAGNOSIS — O4590 Premature separation of placenta, unspecified, unspecified trimester: Secondary | ICD-10-CM | POA: Insufficient documentation

## 2012-09-07 DIAGNOSIS — O4690 Antepartum hemorrhage, unspecified, unspecified trimester: Secondary | ICD-10-CM | POA: Insufficient documentation

## 2012-09-07 NOTE — MAU Provider Note (Signed)
Chief Complaint:  Vaginal Bleeding   First Provider Initiated Contact with Patient 09/07/12 2131      HPI: Alexis Duarte is a 27 y.o. G3P1101 at [redacted]w[redacted]d who presents to maternity admissions reporting vaginal bleeding. The patient states that she has a known abruption and was admitted around 27 weeks for evaluation. Since then she had stopped bleeding completely for almost 1 month. She had been having contractions off and on all day about every hour. Bleeding started around 830 pm today. Patient denies abdominal pain aside from contractions. She also denies fever. She has had some nausea without vomiting today. She reports good fetal movement.   Past Medical History: Past Medical History  Diagnosis Date  . Headache   . Fetal demise   . Complication of anesthesia 07/13/12    nerve tweaked 2 post epid cath    Past obstetric history: OB History    Grav Para Term Preterm Abortions TAB SAB Ect Mult Living   3 2 1 1      1      # Outc Date GA Lbr Len/2nd Wgt Sex Del Anes PTL Lv   1 PRE 2/07 [redacted]w[redacted]d   M SVD   SB   2 TRM 12/07 [redacted]w[redacted]d   F    Yes   3 CUR               Past Surgical History: Past Surgical History  Procedure Date  . Stitches to head as a child     Family History: Family History  Problem Relation Age of Onset  . Hypertension Mother   . Diabetes Mother   . Heart disease Mother   . Cancer Father   . Diabetes Father   . Hypertension Father     Social History: History  Substance Use Topics  . Smoking status: Current Every Day Smoker -- 0.5 packs/day for 4 years    Types: Cigarettes  . Smokeless tobacco: Not on file  . Alcohol Use: No    Allergies:  Allergies  Allergen Reactions  . Zithromax (Azithromycin) Nausea And Vomiting    Meds:  Prescriptions prior to admission  Medication Sig Dispense Refill  . acetaminophen (TYLENOL) 500 MG tablet Take 1,000 mg by mouth every 8 (eight) hours as needed. For pain/headache      . ondansetron (ZOFRAN-ODT) 8 MG  disintegrating tablet Take 8 mg by mouth every 8 (eight) hours as needed. nausea      . Prenatal Vit-Fe Fumarate-FA (PRENATAL MULTIVITAMIN) TABS Take 1 tablet by mouth at bedtime.         ROS: Pertinent findings in history of present illness.  Physical Exam  Blood pressure 122/83, pulse 88, temperature 97.5 F (36.4 C), temperature source Oral, resp. rate 18, height 5\' 3"  (1.6 m), weight 143 lb (64.864 kg), last menstrual period 01/22/2012. GENERAL: Well-developed, well-nourished female in no acute distress.  HEENT: normocephalic HEART: normal rate and rhythm RESP: normal effort, clear to auscultation ABDOMEN: Soft, mild tenderness to palpation of the right mid abdomen, gravid appropriate for gestational age EXTREMITIES: Nontender, no edema NEURO: alert and oriented SPECULUM EXAM: moderate amount of dark red blood in the vaginal vault, cervix clean, cervix is not visually dilated.     FHT:  Baseline 140 , moderate variability, accelerations present, no decelerations Contractions: occasional    Imaging:   MAU Course: Discussed patient with Dr. Claiborne Billings. She has asked that we order [place] to evaluate previously diagnosed abruption.  Discussed [place] results and reactive NST with occasional  contractions with Dr. Claiborne Billings. She would like to discharge the patient with instructions to return with worsening bleeding, more frequent contractions or pain. She would also like to encourage pelvic rest, limited activity, and smoking cessation. Patient will follow-up in the office next week.   Assessment: 1. Placental abruption     Plan: Discharge home Labor precautions and fetal kick counts discussed Patient encouraged to stop smoking Bleeding precautions discussed. Patient to return if bleeding worsens or contractions are more frequent Pelvic rest encouraged. Patient should make follow-up appointment for next week with [facility] Patient may return to MAU as needed or if her condition should  change or worsen       Follow-up Information    Call Philip Aspen, DO. (Make appointment next week)    Contact information:   [address] [place] [address] [phone]           Medication List     As of 09/07/2012 11:03 PM    TAKE these medications         acetaminophen 500 MG tablet   Commonly known as: TYLENOL   Take 1,000 mg by mouth every 8 (eight) hours as needed. For pain/headache      ondansetron 8 MG disintegrating tablet   Commonly known as: ZOFRAN-ODT   Take 8 mg by mouth every 8 (eight) hours as needed. nausea      prenatal multivitamin Tabs   Take 1 tablet by mouth at bedtime.          Freddi Starr, PA-C 09/07/2012 11:03 PM

## 2012-09-07 NOTE — MAU Note (Signed)
[place] in to obtain bedside US

## 2012-09-07 NOTE — MAU Note (Signed)
I have an abruption since 27wks. Having ctx all day. Started bleeding at 2045

## 2012-09-07 NOTE — MAU Note (Signed)
Called ultrasound to perform bedside US

## 2012-09-12 ENCOUNTER — Other Ambulatory Visit: Payer: Self-pay

## 2012-09-12 ENCOUNTER — Other Ambulatory Visit (HOSPITAL_COMMUNITY): Payer: Self-pay | Admitting: Obstetrics and Gynecology

## 2012-09-12 DIAGNOSIS — O4590 Premature separation of placenta, unspecified, unspecified trimester: Secondary | ICD-10-CM

## 2012-09-12 DIAGNOSIS — O26849 Uterine size-date discrepancy, unspecified trimester: Secondary | ICD-10-CM

## 2012-09-12 DIAGNOSIS — O09299 Supervision of pregnancy with other poor reproductive or obstetric history, unspecified trimester: Secondary | ICD-10-CM

## 2012-09-14 ENCOUNTER — Ambulatory Visit (HOSPITAL_COMMUNITY)
Admission: RE | Admit: 2012-09-14 | Discharge: 2012-09-14 | Disposition: A | Discharge status: Home / Self Care | Payer: Medicaid Other | Source: Ambulatory Visit | Attending: Obstetrics and Gynecology | Admitting: Obstetrics and Gynecology

## 2012-09-14 ENCOUNTER — Encounter (HOSPITAL_COMMUNITY): Payer: Self-pay | Admitting: General Practice

## 2012-09-14 ENCOUNTER — Inpatient Hospital Stay (HOSPITAL_COMMUNITY)
Admission: AD | Admit: 2012-09-14 | Discharge: 2012-09-24 | DRG: 782 | Disposition: A | Discharge status: Home / Self Care | Payer: Medicaid Other | Source: Ambulatory Visit | Attending: Obstetrics and Gynecology | Admitting: Obstetrics and Gynecology

## 2012-09-14 ENCOUNTER — Other Ambulatory Visit (HOSPITAL_COMMUNITY): Payer: Self-pay | Admitting: Obstetrics and Gynecology

## 2012-09-14 DIAGNOSIS — O365990 Maternal care for other known or suspected poor fetal growth, unspecified trimester, not applicable or unspecified: Secondary | ICD-10-CM | POA: Diagnosis present

## 2012-09-14 DIAGNOSIS — O4590 Premature separation of placenta, unspecified, unspecified trimester: Secondary | ICD-10-CM

## 2012-09-14 DIAGNOSIS — O09299 Supervision of pregnancy with other poor reproductive or obstetric history, unspecified trimester: Secondary | ICD-10-CM

## 2012-09-14 DIAGNOSIS — O26849 Uterine size-date discrepancy, unspecified trimester: Secondary | ICD-10-CM

## 2012-09-14 LAB — CBC
HCT: 33.3 % — ABNORMAL LOW (ref 36.0–46.0)
Hemoglobin: 11.3 g/dL — ABNORMAL LOW (ref 12.0–15.0)
MCH: 31.3 pg (ref 26.0–34.0)
MCV: 92.2 fL (ref 78.0–100.0)
RBC: 3.61 MIL/uL — ABNORMAL LOW (ref 3.87–5.11)

## 2012-09-14 MED ORDER — DOCUSATE SODIUM 100 MG PO CAPS
100.0000 mg | ORAL_CAPSULE | Freq: Every day | ORAL | Status: DC
  Administered 2012-09-16 – 2012-09-24 (×9): 100 mg via ORAL
  Filled 2012-09-14 (×10): qty 1

## 2012-09-14 MED ORDER — ZOLPIDEM TARTRATE 5 MG PO TABS
5.0000 mg | ORAL_TABLET | Freq: Every evening | ORAL | Status: DC | PRN
  Administered 2012-09-23: 5 mg via ORAL
  Filled 2012-09-14: qty 1

## 2012-09-14 MED ORDER — PRENATAL MULTIVITAMIN CH
1.0000 | ORAL_TABLET | Freq: Every day | ORAL | Status: DC
  Administered 2012-09-17: 1 via ORAL
  Filled 2012-09-14 (×3): qty 1

## 2012-09-14 MED ORDER — BETAMETHASONE SOD PHOS & ACET 6 (3-3) MG/ML IJ SUSP
12.0000 mg | Freq: Once | INTRAMUSCULAR | Status: AC
  Administered 2012-09-14: 12 mg via INTRAMUSCULAR
  Filled 2012-09-14: qty 2

## 2012-09-14 MED ORDER — CALCIUM CARBONATE ANTACID 500 MG PO CHEW: 2.0000 | CHEWABLE_TABLET | ORAL | Status: DC | PRN

## 2012-09-14 MED ORDER — ONDANSETRON HCL 4 MG PO TABS: 8.0000 mg | ORAL_TABLET | Freq: Three times a day (TID) | ORAL | Status: DC | PRN

## 2012-09-14 MED ORDER — ACETAMINOPHEN 325 MG PO TABS
650.0000 mg | ORAL_TABLET | ORAL | Status: DC | PRN
  Administered 2012-09-15 – 2012-09-23 (×8): 650 mg via ORAL
  Filled 2012-09-14 (×8): qty 2

## 2012-09-14 MED ORDER — SODIUM CHLORIDE 0.9 % IJ SOLN
3.0000 mL | Freq: Two times a day (BID) | INTRAMUSCULAR | Status: DC
  Administered 2012-09-14 – 2012-09-24 (×20): 3 mL via INTRAVENOUS

## 2012-09-14 NOTE — Consult Note (Signed)
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  Alexis Duarte was admitted on 6 December at 23+ weeks due to bright red vaginal bleeding "after falling" (denies abdominal trauma).  Ultrasound at that time revealed a 10.7 x 5.0 x 7.7 cm heterogenous retroplacental area consistent with abruption.  She completed a course of betamethasone at that time.  She reports that her vaginal bleeding resolved- later signed out AMA on 16 December.  Since that time, she reports that she did not have any additional vaginal bleeding until last week (spotting) and again today.  This afternoon, she reports a small amount of bright red vaginal blood - not enough to fill a pad.  She reports having some irregular contractions about 1-2x / hr, but no abdominal pain.  Recent office ultrasound showed lagging fetal growth.  Her pregnancy is also complicated by smoking history (reports smoking about 1 pack/ week).   She is seen today for recommendations regarding timing of delivery.   OB History: OB History    Grav Para Term Preterm Abortions TAB SAB Ect Mult Living   3 2 1 1      1     G1- 34 week IUFD "placental insufficiency".  She denies having vaginal bleeding.  Was told that there was a short umbilical cord and a possible cord accident.  G2 - term SVD without complications  PMH:  Past Medical History  Diagnosis Date  . Headache   . Fetal demise   . Complication of anesthesia 07/13/12    nerve tweaked 2 post epid cath    PSH:  Past Surgical History  Procedure Date  . Stitches to head as a child    Meds: Zofran, Prenatal vitamins  Allergies: Zithromax  FH: Denies family history of birth defects or hereditary disorders  Soc: tobacco- 1 pack/ week, denies ETOH use or illicit  drug use during pregnancy  Review of System: no LOF, no nausea/vomiting. All other systems reviewed and are negative.  PE:  145/97, repeat 116/80, 147#, Pulse 95  GEN: well-appearing female ABD: gravid, NT  Ultrasound: Single IUP at 32 3/7 weeks Chronic abruption- there appears to be a 10 x 5 cm area of formed clot along the lower edge of the placenta The estimated fetal weight today is at the 10th %tile.  The [facility] is < 3rd percentile Elevated umbilical artery Doppler studies for gestational age.  No evidence of absent or reversed diastolic flow BPP 8/8 Normal amniotic fluid volume   A/P: 1) IUP at 32 3/7 weeks         2) Chronic abruption         3) Fetal growth restriction with EFW at the 10th %tile (1385 g)         4) Smoking history  Recommendations: We had a long discussion regarding the management of abruption.  She and her husband are aware that acute/ complete abruption cannot be predicted and the fact that she had an episode of bleeding today in addition to the ultrasound findings of fetal growth restriction and what appears to be a chronic abruption are very concerning and that my recommendations would be hospitalization for close observation and fetal testing.  After counseling, the patient has elected to be admitted this evening.  1) Recommend repeating  course of betamethasone (now > 1 month since initial course of betamethasone) 2) Recommend at least daily NSTs 3) Maintain IV access in the event of heavy vaginal bleeding 4) Would continue inpatient observation for a minimum of 5-7 days without vaginal bleeding.  If the patient continues to have vaginal bleeding, would continue inpatient observation until delivery.   5) Weekly BPPs with UA Dopplers with MFM 6) Follow up growth scan in 2 weeks with MFM  Would move toward delivery at any gestational age for non-reassuring fetal testing or heavy vaginal bleeding.  At 34 weeks, would move toward delivery for absent/ reversed  diastolic flow on UA Dopplers or marginal interval fetal growth. If otherwise stable (normal testing, interval growth and vaginal bleeding resolves) would recommend delivery no later than [redacted] weeks gestation.  Thank you for the opportunity to be a part of the care of [name]. Please contact our office if we can be of further assistance.   I spent approximately 30 minutes with this patient with over 50% of time spent in face-to-face counseling.  Alexis Gula, MD Maternal Fetal Medicine

## 2012-09-15 MED ORDER — BETAMETHASONE SOD PHOS & ACET 6 (3-3) MG/ML IJ SUSP
12.0000 mg | Freq: Once | INTRAMUSCULAR | Status: AC
  Administered 2012-09-15: 12 mg via INTRAMUSCULAR
  Filled 2012-09-15: qty 2

## 2012-09-15 NOTE — Progress Notes (Signed)
Dareen Piano, MD, updated on FHR, UC, and pad counts. MD also notified of patients request to have wheelchair privileges outside to see daughter. Orders received for patient to have wheelchair privilege and for patient to receive another dose of BMZ.

## 2012-09-15 NOTE — H&P (Signed)
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 Recently the EFW has declined to the 10%. Pt will receive a second dose of Betamethasone. Please MFM consult for recommendations and plan.

## 2012-09-16 MED ORDER — ONDANSETRON 8 MG PO TBDP
8.0000 mg | ORAL_TABLET | Freq: Three times a day (TID) | ORAL | Status: DC | PRN
  Administered 2012-09-17 – 2012-09-23 (×6): 8 mg via ORAL
  Filled 2012-09-16 (×7): qty 1

## 2012-09-16 NOTE — Progress Notes (Signed)
Pt states that she had light spotting yesterday. Good FM. No contractions. PLAN/ Check dopplers and BPP in a week. Continue BR

## 2012-09-16 NOTE — Progress Notes (Signed)
Pt c/o nausea and was asking for Zofran.  She decided to take her own from home since she felt an imminent need. We discussed why she needs to the meds prescribed to her here, but she is concerned that the po Zofran does not work fast enough.  Will discuss with MD. Pt verbalized understanding.

## 2012-09-17 MED ORDER — NIFEDIPINE 10 MG PO CAPS
10.0000 mg | ORAL_CAPSULE | Freq: Once | ORAL | Status: AC
  Administered 2012-09-17: 10 mg via ORAL
  Filled 2012-09-17: qty 1

## 2012-09-17 NOTE — Progress Notes (Signed)
Ur chart review completed.  

## 2012-09-17 NOTE — Progress Notes (Signed)
Pt stable.  Minimal spotting.  Denies ctx. Good fetal movement.  No other complaints.  FHT reassuring tracing.  S/p beta x 2.  Will continue current mgmt.

## 2012-09-18 NOTE — Progress Notes (Signed)
27 y.o. G3P1101 [redacted]w[redacted]d HD#4 admitted for 39 WEEKS SPOTTING.  Pt currently stable with no c/o ctxes or bleeding today but she did have some bright red bleeding with contractions yesterday.  Good FM.  Filed Vitals:   09/17/12 1525 09/17/12 2015 09/17/12 2358 09/18/12 0800  BP: 121/81 127/82 106/66 95/67  Pulse: 90 92 89 79  Temp: 98 F (36.7 C) 98.2 F (36.8 C) 97.9 F (36.6 C) 98.5 F (36.9 C)  TempSrc: Oral Oral Oral Oral  Resp: 18 18 18 18   Height:      Weight:         Lungs CTA Cor RRR Abd  Soft, gravid, nontender Ex SCDs FHTs  140ss, good short term variability, NST R; occ mild variables, class 1. Toco  q 10-20  U/S  Vtx, 1385 gm (<10%ile);  AC <3%ile.  AFI 14.  Dopplers decreased but no absent or reversed flow.  Clot 10x5.  A:  HD#4  [redacted]w[redacted]d with chronic abruption.  P: 1) Repeated course of betamethasone (now > 1 month since initial course of betamethasone)  2) Daily NSTs  3) Maintain IV access in the event of heavy vaginal bleeding  4) Continue inpatient observation for a minimum of 5-7 days without vaginal bleeding. If the patient continues to have vaginal bleeding, would continue inpatient observation until delivery.   Anticipate keeping patient now since she had bleeding yesterday. 5) Weekly BPPs with UA Dopplers with MFM  6) Follow up growth scan in 2 weeks with MFM  7) H/H was 11.3/33.  Pt is on PNV. 8) Pt is asking about elective C/S even in the event of normal labor or elective induction for reasons below.  I discussed with pt risks involved with C/S and we will continue to discuss options.  Would move toward delivery at any gestational age for non-reassuring fetal testing or heavy vaginal bleeding. At 34 weeks, would move toward delivery for absent/ reversed diastolic flow on UA Dopplers or marginal interval fetal growth. If otherwise stable (normal testing, interval growth and vaginal bleeding resolves) would recommend delivery no later than [redacted] weeks  gestation.   Eliav Mechling A

## 2012-09-19 MED ORDER — COMPLETENATE 29-1 MG PO CHEW
1.0000 | CHEWABLE_TABLET | Freq: Every day | ORAL | Status: DC
  Administered 2012-09-19 – 2012-09-24 (×6): 1 via ORAL
  Filled 2012-09-19 (×7): qty 1

## 2012-09-19 NOTE — Progress Notes (Signed)
Patient in good spirits, no complaints.  No further bleeding since the day before yestderday.  Good FM, denies ctx.  No calf pain.  No CP/SOB.  No constipation.  Voiding without issue.  Filed Vitals:   09/19/12 0700 09/19/12 0800 09/19/12 0900 09/19/12 0925  BP: 132/89     Pulse: 93     Temp: 97.8 F (36.6 C)     TempSrc: Oral     Resp: 18 18 18 18   Height:      Weight:         Abd: gravid, NT Ext: no CT  Lab Results  Component Value Date   WBC 10.3 09/14/2012   HGB 11.3* 09/14/2012   HCT 33.3* 09/14/2012   MCV 92.2 09/14/2012   PLT 169 09/14/2012    --/--/O POS (02/10 0724)  A/P HD#5, chronic placental abruption, admitted for vaginal spotting. 1) Repeat beta x 2 ordered by Dr. Henderson Cloud yesterday 2) Daily NSTs per MFM 3) Maintain IV access in the event of heavy vaginal bleeding  4) Continue inpatient observation for a minimum of 5-7 days without vaginal bleeding (last bleed 2/10). If the patient continues to have vaginal bleeding, would continue inpatient observation until delivery. Anticipate keeping patient now since she had bleeding yesterday.  5) Weekly BPPs with UA Dopplers with MFM ( due 2/14) 6) Follow up growth scan due 2/21with MFM (last [place] 2/7)  Cont. NSTs qshift.    Routine care.    Philip Aspen

## 2012-09-20 ENCOUNTER — Inpatient Hospital Stay (HOSPITAL_COMMUNITY): Payer: Medicaid Other

## 2012-09-20 LAB — TYPE AND SCREEN
ABO/RH(D): O POS
Antibody Screen: NEGATIVE

## 2012-09-20 MED ORDER — HYDROCORTISONE 1 % EX CREA
TOPICAL_CREAM | Freq: Four times a day (QID) | CUTANEOUS | Status: DC | PRN
  Administered 2012-09-20 – 2012-09-23 (×3): via TOPICAL
  Filled 2012-09-20: qty 28

## 2012-09-20 NOTE — Progress Notes (Signed)
Pt without complaints. No bleeding since 2/10. Good FM. No contractions. PLAN/ Check BPP and dopplers tomorrow.

## 2012-09-21 ENCOUNTER — Inpatient Hospital Stay (HOSPITAL_COMMUNITY): Payer: Medicaid Other

## 2012-09-21 LAB — TYPE AND SCREEN
Unit division: 0
Unit division: 0

## 2012-09-21 MED ORDER — ENSURE COMPLETE PO LIQD
237.0000 mL | ORAL | Status: AC
  Administered 2012-09-21: 237 mL via ORAL
  Filled 2012-09-21: qty 237

## 2012-09-21 NOTE — Progress Notes (Signed)
Antenatal Nutrition Assessment:  Currently  33 3/[redacted] weeks gestation, with chronic abruption. Height  63" Weight 141 Lbs pre-pregnancy weight 133 Lbs.Pre-pregnancy  BMI 23.6  IBW 115 Lbs  Total weight gain 8 Lbs. Weight gain goals 25-35 Lbs  ** of note, admission weight was 147 Lbs.  Pt was weighed on different scales, but potentially has had a 6 Lb weight loss.  Estimated needs: 1800-2000 kcal/day, 65-75 grams protein/day, 2.1 liters fluid/day  Regular diet tolerated well, appetite good. Have changed diet order to antenatal regular to allow snacks TID. Pt requests greek yogurt to increase protein intake as well as ensure 1 time per day. Allowed double protein portions at meals Current diet prescription will provide for increased needs.  No abnormal nutrition related labs  Nutrition Dx: Increased nutrient needs r/t pregnancy and fetal growth requirements aeb [redacted] weeks gestation.  No educational needs assessed at this time.  Alexis Duarte M.Odis Luster LDN Neonatal Nutrition Support Specialist Pager [phone]

## 2012-09-21 NOTE — Progress Notes (Addendum)
27 y.o. [id number] [redacted]w[redacted]d HD#7 admitted for 52 WEEKS SPOTTING.  Pt currently stable with no c/o.  Good FM.  Filed Vitals:   09/20/12 1009 09/20/12 1414 09/20/12 1952 09/21/12 0204  BP: 112/82 133/86 130/86 131/95  Pulse: 103 90 77 86  Temp: 98.2 F (36.8 C)  97.8 F (36.6 C) 98.2 F (36.8 C)  TempSrc: Oral  Oral Oral  Resp: 18 20 16 16   Height:      Weight:        Lungs CTA Cor RRR Abd  Soft, gravid, nontender Ex SCDs FHTs  130s, good short term variability, NST R Toco  occ  BPP 8/8, Dopplers >95%ile with forward flow but periods of absent flow.  A:  HD#7  [redacted]w[redacted]d with chronic abruption.  P:  1) Repeated course of betamethasone done. 2) Daily NSTs  3) Maintain IV access in the event of heavy vaginal bleeding  4) Continue inpatient observation for a minimum of 5-7 days without vaginal bleeding. If the patient continues to have vaginal bleeding, would continue inpatient observation until delivery.  5) Weekly BPPs with UA Dopplers with MFM- done today, overall reassuring but transient periods of absent diastolic flow.  Pt to stay in house for now. 6) Follow up growth scan in 2 weeks with MFM  7) H/H was 11.3/33. Pt is on PNV.  8) D/w pt risks involved with C/S and we will continue to discuss options; she currently agrees to trial of labor.  D/w pt difference between epidural and spinal and d/w her her dating.   Move toward delivery at any gestational age for non-reassuring fetal testing or heavy vaginal bleeding. At 34 weeks, would move toward delivery for absent/ reversed diastolic flow on UA Dopplers or marginal interval fetal growth. If otherwise stable (normal testing, interval growth and vaginal bleeding resolves) would recommend delivery no later than [redacted] weeks gestation.       [name]

## 2012-09-21 NOTE — Progress Notes (Signed)
I received a referral from pt's nurse.  Alexis Duarte is having a difficult time not being able to be active and care for her daughter (26 years old).  She has some anxiety about her baby's health which is understandable given her history of a previous loss at 34 weeks.  She reported that as she approaches 34 weeks with this pregnancy, her anxiety is increasing.  Because of her history and her belief that her doctor at the time (from her previous loss) did not listen to her, she is somewhat distrustful of medical team and tends to trust her own instincts.  She is eager to have an opportunity to talk with the doctor today so that she can find out more information about what is going on.  I offered compassionate listening and pastoral presence.  We will continue to follow up with her, but please also page as needs arise.  [address] [place] Pager, [phone] 11:29 AM   09/21/12 1100  Clinical Encounter Type  Visited With Patient  Visit Type Spiritual support  Referral From Nurse  Stress Factors  Patient Stress Factors Loss of control;Family relationships;Exhausted

## 2012-09-21 NOTE — Progress Notes (Signed)
Alexis Duarte was in better spirits this afternoon and is looking forward to hopefully seeing her daughter and boyfriend soon.  She was able to come to an understanding that she needs to be here right now to do what is best for the baby.  She was grateful for the talk with her doctor and grateful for all the care and extra attention on Valentine's Day.  [address] [place] Pager, [phone] 3:25 PM   09/21/12 1500  Clinical Encounter Type  Visited With Patient  Visit Type Follow-up

## 2012-09-22 MED ORDER — ENSURE COMPLETE PO LIQD
237.0000 mL | ORAL | Status: DC
  Administered 2012-09-22: 237 mL via ORAL
  Filled 2012-09-22: qty 237

## 2012-09-22 MED ORDER — ENSURE COMPLETE PO LIQD
237.0000 mL | Freq: Two times a day (BID) | ORAL | Status: DC
  Administered 2012-09-22 – 2012-09-24 (×4): 237 mL via ORAL
  Filled 2012-09-22 (×5): qty 237

## 2012-09-22 NOTE — Progress Notes (Signed)
Pt on the phone with family. Pt seems stressed and upset with phone call.

## 2012-09-22 NOTE — Progress Notes (Signed)
Provider made aware of pt status: uterine contraction pattern, FHT tracing, pain level. New orders given.

## 2012-09-22 NOTE — Progress Notes (Signed)
27 y.o. [id number] [redacted]w[redacted]d HD#8 admitted for partial abruption and IUGR.  Pt currently stable with no c/o.  Good FM.  Filed Vitals:   09/21/12 2049 09/21/12 2358 09/22/12 0815 09/22/12 0855  BP: 129/88 126/82    Pulse: 90 97    Temp: 98.4 F (36.9 C) 97.8 F (36.6 C)    TempSrc:  Oral    Resp: 20 18 20 20   Height:      Weight:        Lungs CTA Cor RRR Abd  Soft, gravid, nontender Ex SCDs FHTs  130s, good short term variability, NST R; one moderate variable last night; FHTs this am P Toco  occ  No results found for this or any previous visit (from the past 24 hour(s)).  A:  HD#8  [redacted]w[redacted]d with chroni abruption and SGA.  P: Continue plan as outlined by MFM.  Neveyah Garzon A

## 2012-09-23 LAB — TYPE AND SCREEN

## 2012-09-23 MED ORDER — NIFEDIPINE 10 MG PO CAPS
10.0000 mg | ORAL_CAPSULE | Freq: Once | ORAL | Status: AC
  Administered 2012-09-23: 10 mg via ORAL
  Filled 2012-09-23: qty 1

## 2012-09-23 MED ORDER — BUTORPHANOL TARTRATE 1 MG/ML IJ SOLN
1.0000 mg | Freq: Once | INTRAMUSCULAR | Status: AC
  Administered 2012-09-23: 1 mg via INTRAVENOUS
  Filled 2012-09-23: qty 1

## 2012-09-23 MED ORDER — BUTORPHANOL TARTRATE 1 MG/ML IJ SOLN: 1.0000 mg | INTRAMUSCULAR | Status: DC | PRN

## 2012-09-23 NOTE — Progress Notes (Signed)
27 y.o. [id number] [redacted]w[redacted]d HD#9 admitted for 4 WEEKS SPOTTING.  Pt currently stable with no c/o.  Good FM.  Filed Vitals:   09/22/12 2252 09/23/12 0037 09/23/12 0758 09/23/12 0759  BP: 130/90 137/93  111/71  Pulse: 91 92  107  Temp:   98.1 F (36.7 C)   TempSrc:   Oral   Resp: 20 20 20    Height:      Weight:        Lungs CTA Cor RRR Abd  Soft, gravid, nontender Ex SCDs FHTs  Last night 120s, good short term variability, NST R Toco  Last night a run of ctxes but resolved with procardia and stadol.   A:  HD#9  [redacted]w[redacted]d with chronic abruption.  P: Continue plan as outlined by MFM but will also get dopplers 3 days early tomorrow to f/u on the intermittent absent flow.  Dorrien Grunder A

## 2012-09-24 ENCOUNTER — Inpatient Hospital Stay (HOSPITAL_COMMUNITY): Payer: Medicaid Other

## 2012-09-24 NOTE — Discharge Summary (Addendum)
Physician Discharge Summary  Patient ID: Alexis Duarte MRN: [id number] DOB/AGE: 08-11-1985 27 y.o.  Admit date: 09/14/2012 Discharge date: 09/24/2012  Admission Diagnoses:  Chronic abruption and FGR  Discharge Diagnoses:  Chronic abruption and FGR, baby and mother are clinically stable.  Active Problems:   * No active hospital problems. *   Discharged Condition: stable  Hospital Course: Admitted with bleeding from chronic abruption and FGR.  Dopplers borderline normal.  BPP 8/10 (no fetal breathing).  Patient observed for 10 days with no sign of significant fetal compromise or maternal bleeding.  Borderline normal dopplers, normal BPP and reactive NSTs.  Patient has history of previous stillbirth at 34 weeks.  She lives 30 minutes away from hospital.  I explained that the reactive tracing and normal BPP are reassuring.   I explained that an acute event could not be ruled out and that by going home delivery would be delayed by at least 60 minutes.  I also explained that her history of a previous stillbirth could put her at increased risk.  She voiced complete understanding and requested discharge home.  She will return to [facility] on 2/19 for BPP and dopplers.  She is scheduled for repeat fetal growth scan on 2/21.  Consults: MFM  Discharge Exam: Blood pressure 127/92, pulse 101, temperature 98.2 F (36.8 C), temperature source Oral, resp. rate 18, height 5\' 3"  (1.6 m), weight 64.139 kg (141 lb 6.4 oz), last menstrual period 01/22/2012.  Disposition: 01-Home or Self Care   Future Appointments Provider Department Dept Phone   09/26/2012 2:00 PM [facility] [place] [facility] [phone]   09/28/2012 11:30 AM [facility] [place] [facility] [phone]       Medication List    TAKE these medications       acetaminophen 500 MG tablet  Commonly known as:  TYLENOL  Take 1,000 mg by mouth every 8 (eight) hours as needed. For pain/headache      ondansetron 8 MG disintegrating tablet  Commonly known as:  ZOFRAN-ODT  Take 8 mg by mouth every 8 (eight) hours as needed. nausea     prenatal multivitamin Tabs  Take 1 tablet by mouth at bedtime.           Follow-up Information   Follow up with Mickel Baas, MD. Schedule an appointment as soon as possible for a visit in 1 day.   Contact information:   [address] [place] [address] [phone]       Signed: Mickel Baas 09/24/2012, 10:01 AM

## 2012-09-24 NOTE — Progress Notes (Signed)
Alexis Duarte  was seen today for an ultrasound appointment.  See full report in [facility].  Single IUP at 33 6/7 weeks Chronic abruption, suspected fetal growth restriction (< 10th %tile) Elevated UA Dopplers (> 97th %tile).  On one episode, noted to have intermittently absent end-diastolic flow. BPP 8/10 (-2 for absent breathing movement), reasuring fetal tracing on floor  Recommend UA Dopplers/ BPP 3x weekly (Mon, Wed, Friday) Follow up growth scan Friday Would move toward delivery for persisent absent / reversed Diastolic flow on UA Dopplers, heavy vaginal bleeding, or poor interval growth on follow up ultrasound  Alpha Gula, MD

## 2012-09-24 NOTE — Progress Notes (Signed)
Discharged home via wheelchair. Pt verbalized an understanding of discharge instructions.

## 2012-09-24 NOTE — Progress Notes (Signed)
Ur chart review completed.  

## 2012-09-24 NOTE — Progress Notes (Signed)
Verle was in good spirits and was grateful to be going home.  Being here in the hospital is difficult for her especially because it means being away from her daughter.  She feels that being at home, even if just for a little while, will help her to manage her stress better.  She was grateful for the care that she received from all of the staff.   I offered blessings and encouragement.  We will continue to follow up when she returns next to the hospital, but please also page as needs arise.  [employer] Pager, [phone] 11:02 AM   09/24/12 1000  Clinical Encounter Type  Visited With Patient  Visit Type Follow-up

## 2012-09-25 ENCOUNTER — Other Ambulatory Visit (HOSPITAL_COMMUNITY): Payer: Self-pay | Admitting: Obstetrics & Gynecology

## 2012-09-25 DIAGNOSIS — O4590 Premature separation of placenta, unspecified, unspecified trimester: Secondary | ICD-10-CM

## 2012-09-26 ENCOUNTER — Ambulatory Visit (HOSPITAL_COMMUNITY)
Admit: 2012-09-26 | Discharge: 2012-09-26 | Disposition: A | Discharge status: Home / Self Care | Payer: Medicaid Other | Attending: Obstetrics & Gynecology | Admitting: Obstetrics & Gynecology

## 2012-09-26 ENCOUNTER — Other Ambulatory Visit (HOSPITAL_COMMUNITY): Payer: Self-pay | Admitting: Obstetrics & Gynecology

## 2012-09-26 ENCOUNTER — Ambulatory Visit (HOSPITAL_COMMUNITY): Payer: Medicaid Other

## 2012-09-26 DIAGNOSIS — O4590 Premature separation of placenta, unspecified, unspecified trimester: Secondary | ICD-10-CM

## 2012-09-26 DIAGNOSIS — O09299 Supervision of pregnancy with other poor reproductive or obstetric history, unspecified trimester: Secondary | ICD-10-CM | POA: Insufficient documentation

## 2012-09-26 DIAGNOSIS — O365990 Maternal care for other known or suspected poor fetal growth, unspecified trimester, not applicable or unspecified: Secondary | ICD-10-CM | POA: Insufficient documentation

## 2012-09-26 DIAGNOSIS — O99330 Smoking (tobacco) complicating pregnancy, unspecified trimester: Secondary | ICD-10-CM | POA: Insufficient documentation

## 2012-09-26 NOTE — Progress Notes (Signed)
Alexis Duarte  was seen today for an ultrasound appointment.  See full report in [facility].  Impression Single IUP at 34 1/7 weeks Chronic abruption, suspected fetal growth restriction (< 10th %tile) Elevated UA Dopplers (> 97th %tile).  No absent or reversed diastolic flow noted. BPP 8/8  Recommendation Recommend UA Dopplers/ BPP 3x weekly (Mon, Wed, Friday) Follow up growth scan Friday Would move toward delivery for persisent absent / reversed Diastolic flow on UA Dopplers, heavy vaginal bleeding, or poor interval growth on follow up ultrasound  Alpha Gula, MD

## 2012-09-27 ENCOUNTER — Other Ambulatory Visit (HOSPITAL_COMMUNITY): Payer: Self-pay | Admitting: Obstetrics & Gynecology

## 2012-09-27 DIAGNOSIS — O4590 Premature separation of placenta, unspecified, unspecified trimester: Secondary | ICD-10-CM

## 2012-09-28 ENCOUNTER — Inpatient Hospital Stay (HOSPITAL_COMMUNITY): Admit: 2012-09-28 | Payer: Medicaid Other

## 2012-09-28 ENCOUNTER — Encounter (HOSPITAL_COMMUNITY): Payer: Self-pay | Admitting: Anesthesiology

## 2012-09-28 ENCOUNTER — Inpatient Hospital Stay (HOSPITAL_COMMUNITY): Payer: Medicaid Other | Admitting: Anesthesiology

## 2012-09-28 ENCOUNTER — Ambulatory Visit (HOSPITAL_COMMUNITY)
Admission: RE | Admit: 2012-09-28 | Discharge: 2012-09-28 | Disposition: A | Discharge status: Home / Self Care | Payer: Medicaid Other | Source: Ambulatory Visit | Attending: Obstetrics & Gynecology | Admitting: Obstetrics & Gynecology

## 2012-09-28 ENCOUNTER — Encounter (HOSPITAL_COMMUNITY): Payer: Self-pay | Admitting: *Deleted

## 2012-09-28 ENCOUNTER — Inpatient Hospital Stay (HOSPITAL_COMMUNITY)
Admission: AD | Admit: 2012-09-28 | Discharge: 2012-09-30 | DRG: 774 | Disposition: A | Discharge status: Home / Self Care | Payer: Medicaid Other | Source: Ambulatory Visit | Attending: Obstetrics and Gynecology | Admitting: Obstetrics and Gynecology

## 2012-09-28 VITALS — BP 126/88 | Wt 148.2 lb

## 2012-09-28 DIAGNOSIS — O4590 Premature separation of placenta, unspecified, unspecified trimester: Secondary | ICD-10-CM

## 2012-09-28 DIAGNOSIS — O365913 Maternal care for other known or suspected poor fetal growth, first trimester, fetus 3: Secondary | ICD-10-CM

## 2012-09-28 DIAGNOSIS — O365990 Maternal care for other known or suspected poor fetal growth, unspecified trimester, not applicable or unspecified: Principal | ICD-10-CM | POA: Diagnosis present

## 2012-09-28 LAB — TYPE AND SCREEN

## 2012-09-28 LAB — OB RESULTS CONSOLE GC/CHLAMYDIA: Chlamydia: NEGATIVE

## 2012-09-28 LAB — CBC
HCT: 36.2 % (ref 36.0–46.0)
MCH: 30.8 pg (ref 26.0–34.0)
MCV: 90.7 fL (ref 78.0–100.0)
Platelets: 189 10*3/uL (ref 150–400)
RBC: 3.99 MIL/uL (ref 3.87–5.11)
WBC: 12.1 10*3/uL — ABNORMAL HIGH (ref 4.0–10.5)

## 2012-09-28 MED ORDER — OXYCODONE-ACETAMINOPHEN 5-325 MG PO TABS: 1.0000 | ORAL_TABLET | ORAL | Status: DC | PRN

## 2012-09-28 MED ORDER — OXYCODONE-ACETAMINOPHEN 5-325 MG PO TABS
1.0000 | ORAL_TABLET | ORAL | Status: DC | PRN
  Administered 2012-09-29 – 2012-09-30 (×4): 2 via ORAL
  Administered 2012-09-30: 1 via ORAL
  Filled 2012-09-28 (×5): qty 2

## 2012-09-28 MED ORDER — SIMETHICONE 80 MG PO CHEW: 80.0000 mg | CHEWABLE_TABLET | ORAL | Status: DC | PRN

## 2012-09-28 MED ORDER — ZOLPIDEM TARTRATE 5 MG PO TABS: 5.0000 mg | ORAL_TABLET | Freq: Every evening | ORAL | Status: DC | PRN

## 2012-09-28 MED ORDER — ONDANSETRON HCL 4 MG/2ML IJ SOLN: 4.0000 mg | INTRAMUSCULAR | Status: DC | PRN

## 2012-09-28 MED ORDER — EPHEDRINE 5 MG/ML INJ
10.0000 mg | INTRAVENOUS | Status: DC | PRN
  Filled 2012-09-28: qty 4

## 2012-09-28 MED ORDER — ONDANSETRON HCL 4 MG PO TABS: 4.0000 mg | ORAL_TABLET | ORAL | Status: DC | PRN

## 2012-09-28 MED ORDER — LACTATED RINGERS IV SOLN
INTRAVENOUS | Status: DC
  Administered 2012-09-28: 10:00:00 via INTRAVENOUS

## 2012-09-28 MED ORDER — ACETAMINOPHEN 325 MG PO TABS: 650.0000 mg | ORAL_TABLET | ORAL | Status: DC | PRN

## 2012-09-28 MED ORDER — INFLUENZA VIRUS VACC SPLIT PF IM SUSP
0.5000 mL | INTRAMUSCULAR | Status: AC
  Administered 2012-09-29: 0.5 mL via INTRAMUSCULAR
  Filled 2012-09-28: qty 0.5

## 2012-09-28 MED ORDER — IBUPROFEN 600 MG PO TABS
600.0000 mg | ORAL_TABLET | Freq: Four times a day (QID) | ORAL | Status: DC
  Administered 2012-09-28 – 2012-09-30 (×7): 600 mg via ORAL
  Filled 2012-09-28 (×7): qty 1

## 2012-09-28 MED ORDER — PHENYLEPHRINE 40 MCG/ML (10ML) SYRINGE FOR IV PUSH (FOR BLOOD PRESSURE SUPPORT): 80.0000 ug | PREFILLED_SYRINGE | INTRAVENOUS | Status: DC | PRN

## 2012-09-28 MED ORDER — PRENATAL MULTIVITAMIN CH: 1.0000 | ORAL_TABLET | Freq: Every day | ORAL | Status: DC

## 2012-09-28 MED ORDER — ONDANSETRON HCL 4 MG/2ML IJ SOLN
4.0000 mg | Freq: Four times a day (QID) | INTRAMUSCULAR | Status: DC | PRN
  Administered 2012-09-28: 4 mg via INTRAVENOUS
  Filled 2012-09-28: qty 2

## 2012-09-28 MED ORDER — IBUPROFEN 600 MG PO TABS: 600.0000 mg | ORAL_TABLET | Freq: Four times a day (QID) | ORAL | Status: DC | PRN

## 2012-09-28 MED ORDER — TERBUTALINE SULFATE 1 MG/ML IJ SOLN: 0.2500 mg | Freq: Once | INTRAMUSCULAR | Status: DC | PRN

## 2012-09-28 MED ORDER — SENNOSIDES-DOCUSATE SODIUM 8.6-50 MG PO TABS
2.0000 | ORAL_TABLET | Freq: Every day | ORAL | Status: DC
  Administered 2012-09-28 – 2012-09-29 (×2): 2 via ORAL

## 2012-09-28 MED ORDER — FENTANYL 2.5 MCG/ML BUPIVACAINE 1/10 % EPIDURAL INFUSION (WH - ANES)
14.0000 mL/h | INTRAMUSCULAR | Status: DC
  Administered 2012-09-28: 14 mL/h via EPIDURAL
  Filled 2012-09-28: qty 125

## 2012-09-28 MED ORDER — DIBUCAINE 1 % RE OINT: 1.0000 "application " | TOPICAL_OINTMENT | RECTAL | Status: DC | PRN

## 2012-09-28 MED ORDER — TETANUS-DIPHTH-ACELL PERTUSSIS 5-2.5-18.5 LF-MCG/0.5 IM SUSP
0.5000 mL | Freq: Once | INTRAMUSCULAR | Status: AC
  Administered 2012-09-29: 0.5 mL via INTRAMUSCULAR
  Filled 2012-09-28: qty 0.5

## 2012-09-28 MED ORDER — PHENYLEPHRINE 40 MCG/ML (10ML) SYRINGE FOR IV PUSH (FOR BLOOD PRESSURE SUPPORT)
80.0000 ug | PREFILLED_SYRINGE | INTRAVENOUS | Status: DC | PRN
  Filled 2012-09-28: qty 5

## 2012-09-28 MED ORDER — PNEUMOCOCCAL VAC POLYVALENT 25 MCG/0.5ML IJ INJ
0.5000 mL | INJECTION | INTRAMUSCULAR | Status: AC
  Administered 2012-09-29: 0.5 mL via INTRAMUSCULAR
  Filled 2012-09-28: qty 0.5

## 2012-09-28 MED ORDER — PENICILLIN G POTASSIUM 5000000 UNITS IJ SOLR
5.0000 10*6.[IU] | Freq: Once | INTRAVENOUS | Status: AC
  Administered 2012-09-28: 5 10*6.[IU] via INTRAVENOUS
  Filled 2012-09-28: qty 5

## 2012-09-28 MED ORDER — LACTATED RINGERS IV SOLN
500.0000 mL | Freq: Once | INTRAVENOUS | Status: AC
  Administered 2012-09-28: 500 mL via INTRAVENOUS

## 2012-09-28 MED ORDER — CITRIC ACID-SODIUM CITRATE 334-500 MG/5ML PO SOLN: 30.0000 mL | ORAL | Status: DC | PRN

## 2012-09-28 MED ORDER — DIPHENHYDRAMINE HCL 25 MG PO CAPS: 25.0000 mg | ORAL_CAPSULE | Freq: Four times a day (QID) | ORAL | Status: DC | PRN

## 2012-09-28 MED ORDER — LIDOCAINE HCL (PF) 1 % IJ SOLN: 30.0000 mL | INTRAMUSCULAR | Status: DC | PRN

## 2012-09-28 MED ORDER — EPHEDRINE 5 MG/ML INJ: 10.0000 mg | INTRAVENOUS | Status: DC | PRN

## 2012-09-28 MED ORDER — OXYTOCIN 40 UNITS IN LACTATED RINGERS INFUSION - SIMPLE MED: 62.5000 mL/h | INTRAVENOUS | Status: DC

## 2012-09-28 MED ORDER — LIDOCAINE HCL (PF) 1 % IJ SOLN
INTRAMUSCULAR | Status: DC | PRN
  Administered 2012-09-28 (×4): 4 mL

## 2012-09-28 MED ORDER — LACTATED RINGERS IV SOLN: 500.0000 mL | INTRAVENOUS | Status: DC | PRN

## 2012-09-28 MED ORDER — PENICILLIN G POTASSIUM 5000000 UNITS IJ SOLR
2.5000 10*6.[IU] | INTRAVENOUS | Status: DC
  Administered 2012-09-28: 2.5 10*6.[IU] via INTRAVENOUS
  Filled 2012-09-28 (×4): qty 2.5

## 2012-09-28 MED ORDER — DIPHENHYDRAMINE HCL 50 MG/ML IJ SOLN: 12.5000 mg | INTRAMUSCULAR | Status: DC | PRN

## 2012-09-28 MED ORDER — LANOLIN HYDROUS EX OINT: TOPICAL_OINTMENT | CUTANEOUS | Status: DC | PRN

## 2012-09-28 MED ORDER — OXYTOCIN BOLUS FROM INFUSION
500.0000 mL | INTRAVENOUS | Status: DC
  Administered 2012-09-28: 500 mL via INTRAVENOUS

## 2012-09-28 MED ORDER — WITCH HAZEL-GLYCERIN EX PADS: 1.0000 "application " | MEDICATED_PAD | CUTANEOUS | Status: DC | PRN

## 2012-09-28 MED ORDER — OXYTOCIN 40 UNITS IN LACTATED RINGERS INFUSION - SIMPLE MED
1.0000 m[IU]/min | INTRAVENOUS | Status: DC
  Administered 2012-09-28: 1 m[IU]/min via INTRAVENOUS
  Filled 2012-09-28: qty 1000

## 2012-09-28 MED ORDER — BENZOCAINE-MENTHOL 20-0.5 % EX AERO
1.0000 "application " | INHALATION_SPRAY | CUTANEOUS | Status: DC | PRN
  Administered 2012-09-29: 1 via TOPICAL
  Filled 2012-09-28: qty 56

## 2012-09-28 NOTE — Progress Notes (Signed)
C/o nausea 

## 2012-09-28 NOTE — Progress Notes (Signed)
Pt up walking in hallway

## 2012-09-28 NOTE — Progress Notes (Signed)
Patient set up with breast pump and started pumping at 2145 on 09/28/12. Consult put in for lactation to follow up with patient for any questions/concerns.

## 2012-09-28 NOTE — Anesthesia Procedure Notes (Signed)
Epidural Patient location during procedure: OB Start time: 09/28/2012 4:46 PM  Staffing Performed by: anesthesiologist   Preanesthetic Checklist Completed: patient identified, site marked, surgical consent, pre-op evaluation, timeout performed, IV checked, risks and benefits discussed and monitors and equipment checked  Epidural Patient position: sitting Prep: site prepped and draped and DuraPrep Patient monitoring: continuous pulse ox and blood pressure Approach: midline Injection technique: LOR air  Needle:  Needle type: Tuohy  Needle gauge: 17 G Needle length: 9 cm and 9 Needle insertion depth: 5 cm cm Catheter type: closed end flexible Catheter size: 19 Gauge Catheter at skin depth: 10 cm Test dose: negative  Assessment Events: blood not aspirated, injection not painful, no injection resistance, negative IV test and no paresthesia  Additional Notes Discussed risk of headache, infection, bleeding, nerve injury and failed or incomplete block.  Patient voices understanding and wishes to proceed.  Epidural placed easily on first attempt.  No paresthesia.  Patient tolerated procedure well with no apparent complications.  Jasmine December, MD Reason for block:procedure for pain

## 2012-09-28 NOTE — Progress Notes (Signed)
[facility]ound  Indication: 27 yr old G3P1101 at [redacted]w[redacted]d with chronic abruption, fetal growth restriction, abnormal Doppler studies, and previous fetal demise for follow up ultrasound.  Findings: 1. Single intrauterine pregnancy. 2. Estimated fetal weight is in the <10th%. The fetus has gained 18g. 3. Anterior placenta without evidence of previa. 4. Normal amniotic fluid index; although slightly decreased for gestational age. 5. The limited anatomy survey is normal. 6. Normal biophysical profile of 8/8. 7. There is intermittent absent end diastolic flow on umbilical artery Doppler studies.  Recommendations: 1. There has been no significant interval fetal growth. Given no significant fetal growth and abnormal Doppler studies recommend delivery today as risk of stillbirth is significantly increased and placental insufficiency is such that the fetus is not growing. Patient has received 2 courses of betamethasone Discussed may have trial of labor but there is an increased risk of fetal intolerance to labor given chronic abruption and suspected placental insufficiency and therefore patient is at increased risk of needing a C section Patient was sent to labor and delivery for delivery. 2. Previous stillbirth: - previously counseled - unclear what work up had been done - given this history and current pregnancy with fetal growth restriction recommend obtain lupus anticoagulant, anticardiolipin antibodies, and beta 2 glycoprotein - also if patient has future pregnancies recommend starting low dose aspirin 81mg /day in the first trimester 3. Recommend submit placenta to Pathology 4. Recommend continuous fetal monitoring and NICU for delivery  Discussed with Dr. Guy Sandifer, MD

## 2012-09-28 NOTE — H&P (Signed)
Alexis Duarte is a 27 y.o. female presenting for IOL  27 yo Alexis Duarte @ 34+3 presents from MFM for induction of labor for IUGR, Intermittant absent EDF. Pts pregnancy has been complicated by a partial placental abruption at 23 weeks that resulted in hospitalization, bedrest and steroids.  She left the hospital AMA from that admission.  She has been followed with serial growth ultrasounds, BPPs & AFIs.  Given minimal interval growth and intermittant absent end diastolic flow she's admitted for IOL.  Pt understands she is at a significant risk that the fetus will not tolerate labor but wishes to proceed with induction.  History OB History   Grav Para Term Preterm Abortions TAB SAB Ect Mult Living   3 2 1 1      1      Past Medical History  Diagnosis Date  . Headache   . Fetal demise   . Complication of anesthesia 07/13/12    nerve tweaked 2 post epid cath   Past Surgical History  Procedure Laterality Date  . Stitches to head as a child     Family History: family history includes Cancer in her father; Diabetes in her father and mother; Heart disease in her mother; and Hypertension in her father and mother. Social History:  reports that she has been smoking Cigarettes.  She has a 2 pack-year smoking history. She does not have any smokeless tobacco history on file. She reports that she does not drink alcohol or use illicit drugs.   Prenatal Transfer Tool  Maternal Diabetes: No Genetic Screening: Normal Maternal Ultrasounds/Referrals: Abnormal:  Findings:   IUGR Fetal Ultrasounds or other Referrals:  Referred to Materal Fetal Medicine  Maternal Substance Abuse:  Yes:  Type: Smoker Significant Maternal Medications:  None Significant Maternal Lab Results:  None Other Comments:  None  ROS: As above    Last menstrual period 01/22/2012. Exam Physical Exam  Prenatal labs: ABO, Rh: --/--/O POS (02/16 1610) Antibody: NEG (02/16 0655) Rubella:  immune RPR: Nonreactive (08/27 0000)  HBsAg:  Negative (08/27 0000)  HIV: Non-reactive (08/27 0000)  GBS:   unknown  Assessment/Plan: 1) Admit 2) Epidural on request 3) Induction  Alexis Duarte H. 09/28/2012, 9:24 AM

## 2012-09-28 NOTE — Anesthesia Preprocedure Evaluation (Signed)
Anesthesia Evaluation  Patient identified by MRN, date of birth, ID band Patient awake    Reviewed: Allergy & Precautions, H&P , NPO status , Patient's Chart, lab work & pertinent test results, reviewed documented beta blocker date and time   History of Anesthesia Complications Negative for: history of anesthetic complications  Airway Mallampati: I TM Distance: >3 FB Neck ROM: full    Dental  (+) Teeth Intact   Pulmonary Current Smoker,  breath sounds clear to auscultation        Cardiovascular negative cardio ROS  Rhythm:regular Rate:Normal     Neuro/Psych  Headaches, negative psych ROS   GI/Hepatic negative GI ROS, Neg liver ROS,   Endo/Other  negative endocrine ROS  Renal/GU negative Renal ROS  negative genitourinary   Musculoskeletal   Abdominal   Peds  Hematology negative hematology ROS (+)   Anesthesia Other Findings   Reproductive/Obstetrics (+) Pregnancy (IUGR, known abruption)                           Anesthesia Physical Anesthesia Plan  ASA: II  Anesthesia Plan: Epidural   Post-op Pain Management:    Induction:   Airway Management Planned:   Additional Equipment:   Intra-op Plan:   Post-operative Plan:   Informed Consent: I have reviewed the patients History and Physical, chart, labs and discussed the procedure including the risks, benefits and alternatives for the proposed anesthesia with the patient or authorized representative who has indicated his/her understanding and acceptance.     Plan Discussed with:   Anesthesia Plan Comments:         Anesthesia Quick Evaluation

## 2012-09-28 NOTE — Progress Notes (Signed)
NICU in room

## 2012-09-28 NOTE — Consult Note (Addendum)
Neonatology Note:   Attendance at Delivery:    I was asked to attend this NSVD at 34 3/7 weeks following induction for IUGR and intermittent absent EDF.  The mother is a G3P2 O pos, GBS not done 2 pack/day smoker with a partial abruption at 23 weeks. MFM recommended induction at 34 weeks. ROM 1/2 hour prior to delivery, fluid meconium-stained. Infant vigorous with good tone; he had started to breathe spontaneously, but had not cried loudly until after we had a chance to bulb suction him, getting a large amount of dark, thick meconium from his throat. He cried well after that. We DeLee suctioned him due to the meconium and got another 1-2 ml of green fluid out.  Ap 9/9. Lungs clear to ausc in DR. Held by his parents, then transported to the NICU with his father in attendance.   Deatra James, MD

## 2012-09-29 LAB — CBC
HCT: 30.6 % — ABNORMAL LOW (ref 36.0–46.0)
Hemoglobin: 10.4 g/dL — ABNORMAL LOW (ref 12.0–15.0)
MCH: 31.3 pg (ref 26.0–34.0)
MCHC: 34 g/dL (ref 30.0–36.0)
MCV: 92.2 fL (ref 78.0–100.0)
RDW: 14 % (ref 11.5–15.5)

## 2012-09-29 MED ORDER — ONDANSETRON 4 MG PO TBDP
4.0000 mg | ORAL_TABLET | Freq: Three times a day (TID) | ORAL | Status: DC | PRN
  Administered 2012-09-29: 4 mg via ORAL
  Filled 2012-09-29: qty 1

## 2012-09-29 MED ORDER — COMPLETENATE 29-1 MG PO CHEW
1.0000 | CHEWABLE_TABLET | Freq: Every day | ORAL | Status: DC
  Administered 2012-09-29 – 2012-09-30 (×2): 1 via ORAL
  Filled 2012-09-29 (×3): qty 1

## 2012-09-29 NOTE — Progress Notes (Signed)
CSW attempted to meet with pt however she was visiting with infant in the NICU.  CSW will return at a later time.

## 2012-09-29 NOTE — Progress Notes (Signed)
Post Partum Day 1 Subjective: no complaints, up ad lib, voiding and tolerating PO  Objective: Filed Vitals:   09/29/12 0525  BP: 105/63  Pulse: 107  Temp: 97.5 F (36.4 C)  Resp: 18    Physical Exam:  General: alert, cooperative and appears stated age Lochia: appropriate Uterine Fundus: firm   Recent Labs  09/28/12 1227 09/29/12 0536  HGB 12.3 10.4*  HCT 36.2 30.6*    Assessment/Plan: Routine PP care Pt pumping d/t infant in NICU   LOS: 1 day   Traylen Eckels H. 09/29/2012, 9:37 AM

## 2012-09-29 NOTE — Anesthesia Postprocedure Evaluation (Signed)
  Anesthesia Post-op Note  Patient: Alexis Duarte  Procedure(s) Performed: * No procedures listed *  Patient Location: Women's Unit  Anesthesia Type:Epidural  Level of Consciousness: awake, alert  and oriented  Airway and Oxygen Therapy: Patient Spontanous Breathing  Post-op Pain: mild  Post-op Assessment: Post-op Vital signs reviewed and Patient's Cardiovascular Status Stable  Post-op Vital Signs: Reviewed and stable  Complications: No apparent anesthesia complications

## 2012-09-30 MED ORDER — HYDROCODONE-ACETAMINOPHEN 5-500 MG PO TABS: 1.0000 | ORAL_TABLET | ORAL | Status: DC | PRN

## 2012-09-30 MED ORDER — IBUPROFEN 600 MG PO TABS: 600.0000 mg | ORAL_TABLET | Freq: Four times a day (QID) | ORAL | Status: DC | PRN

## 2012-09-30 MED ORDER — DOCUSATE SODIUM 100 MG PO CAPS: 100.0000 mg | ORAL_CAPSULE | Freq: Two times a day (BID) | ORAL | Status: DC

## 2012-09-30 NOTE — Discharge Summary (Signed)
Obstetric Discharge Summary Reason for Admission: induction of labor Prenatal Procedures: ultrasound and antenatal hospitalization for partial abruption.  Betamethasone Intrapartum Procedures: spontaneous vaginal delivery Postpartum Procedures: none Complications-Operative and Postpartum: none Hemoglobin  Date Value Range Status  09/29/2012 10.4* 12.0 - 15.0 g/dL Final     HCT  Date Value Range Status  09/29/2012 30.6* 36.0 - 46.0 % Final    Physical Exam:  General: alert, cooperative and appears stated age 27: appropriate Uterine Fundus: firm  Discharge Diagnoses: Preterm pregnancy delivered due to IUGR  Discharge Information: Date: 09/30/2012 Activity: pelvic rest Diet: routine Medications: Ibuprofen, Colace and Vicodin Condition: improved Instructions: refer to practice specific booklet Discharge to: home Follow-up Information   Follow up with Almon Hercules., MD. Call in 4 weeks. (For a postpartum evaluation)    Contact information:   [address] [place] [address] [phone]       Newborn Data: Live born female  Birth Weight: 3 lb 5.1 oz (1506 g) APGAR: 9, 9  Home with mother.  Hutch Rhett H. 09/30/2012, 12:04 PM

## 2012-09-30 NOTE — Progress Notes (Signed)
Discharge instructions provided to patient at bedside.  Medications, activity, follow up appointments and community resources discussed.  No questions at this time.  Patient left unit in stable condition with prescriptions and personal belongings, accompanied by staff.  Osvaldo Angst, RN-------
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
## 2012-10-01 ENCOUNTER — Ambulatory Visit (HOSPITAL_COMMUNITY): Payer: Medicaid Other

## 2012-10-01 NOTE — Progress Notes (Signed)
Post discharge review completed. 

## 2012-10-03 ENCOUNTER — Ambulatory Visit (HOSPITAL_COMMUNITY): Payer: Medicaid Other

## 2013-06-09 IMAGING — US US OB DETAIL+14 WK
1 series · 12 of 28 positions shown · non-contrast
Comparison: none

[Series 1: us ob detail+14 wk · 0.21mm/px · 12 of 88 slices shown]
[im 4/88]
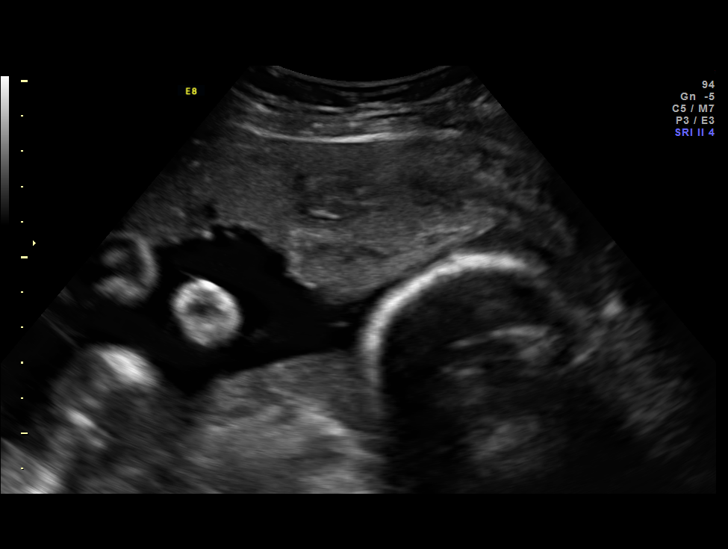
[im 10/88]
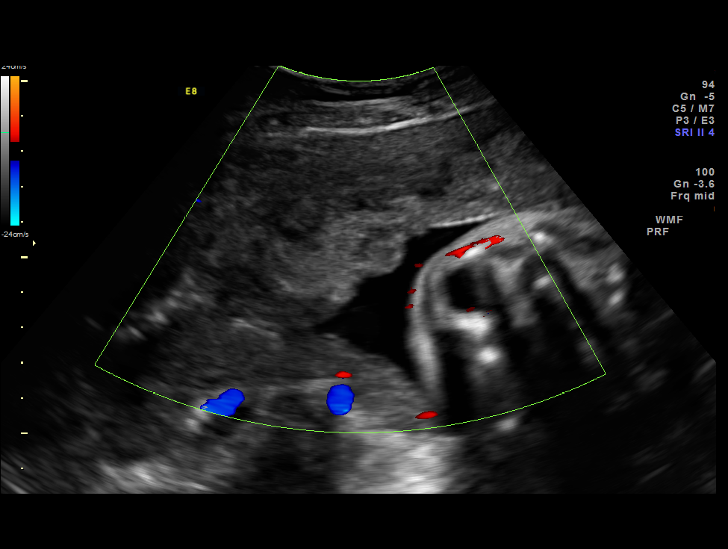
[im 17/88]
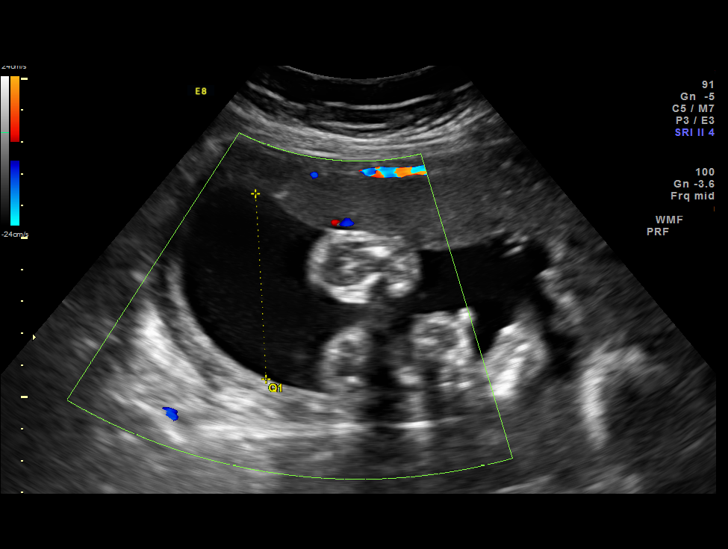
[im 26/88]
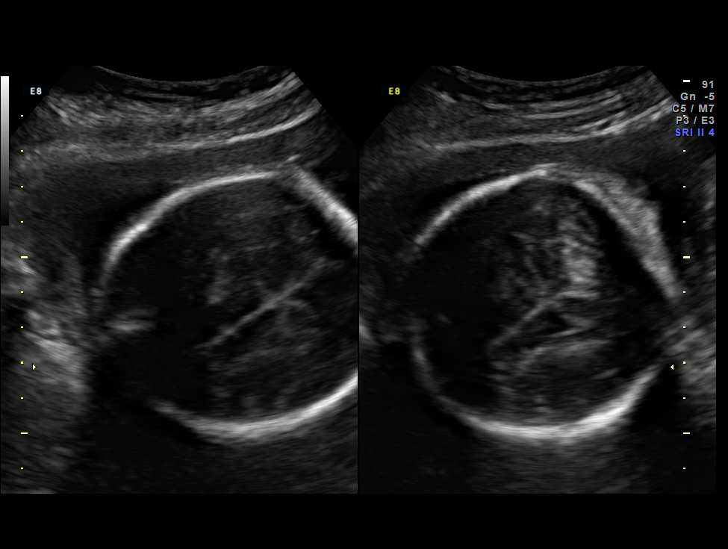
[im 33/88]
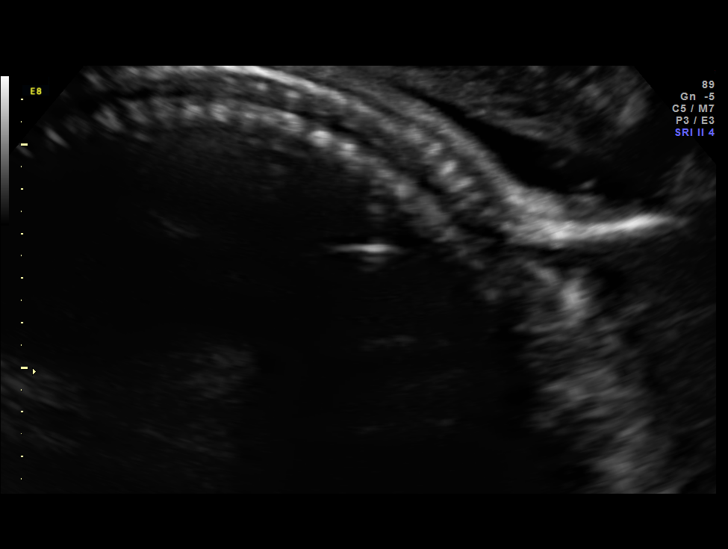
[im 39/88]
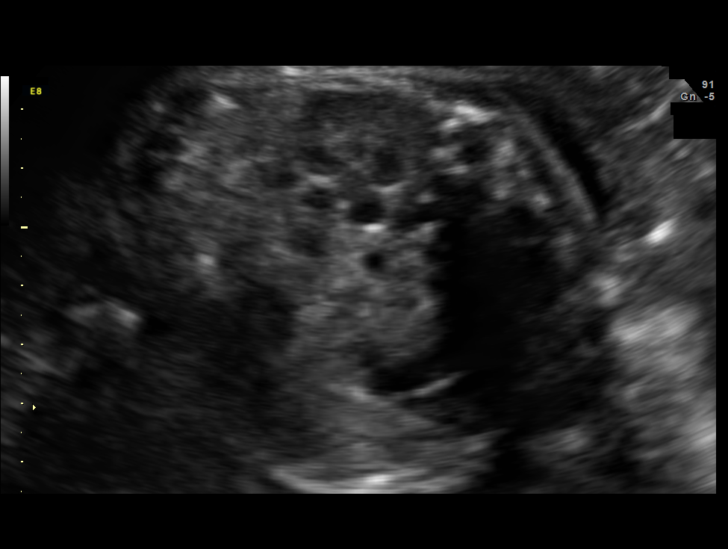
[im 49/88]
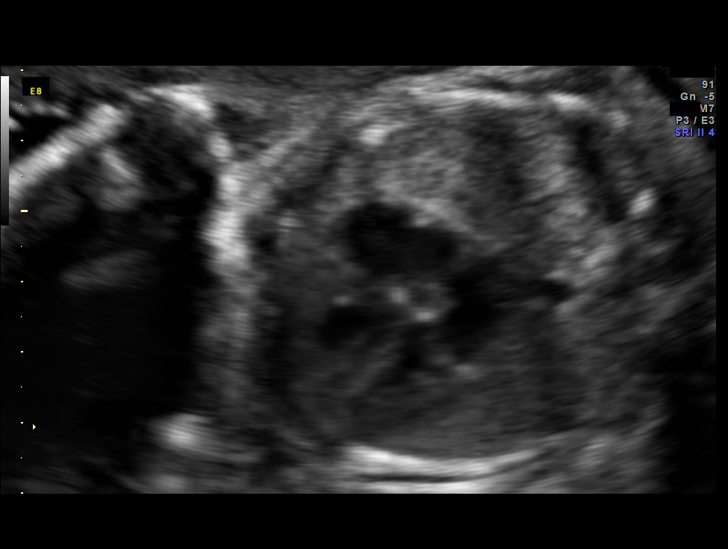
[im 55/88]
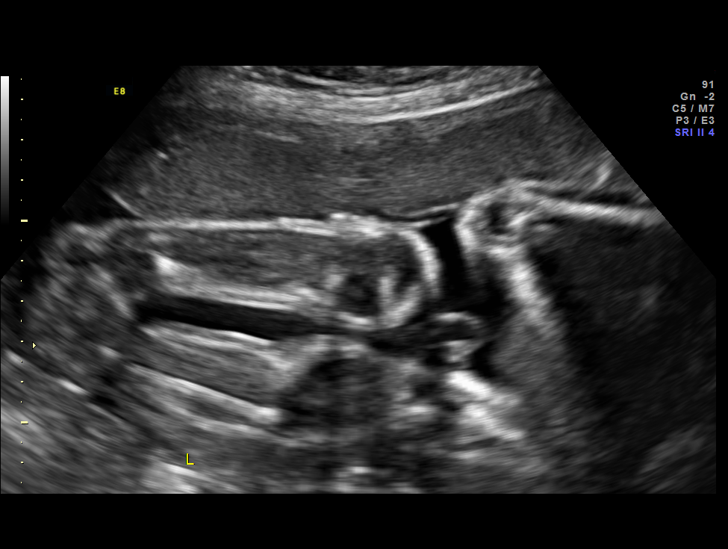
[im 62/88]
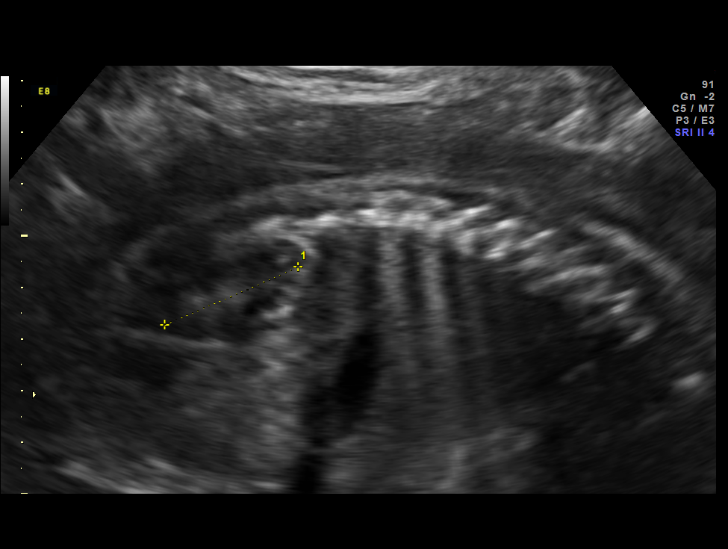
[im 71/88]
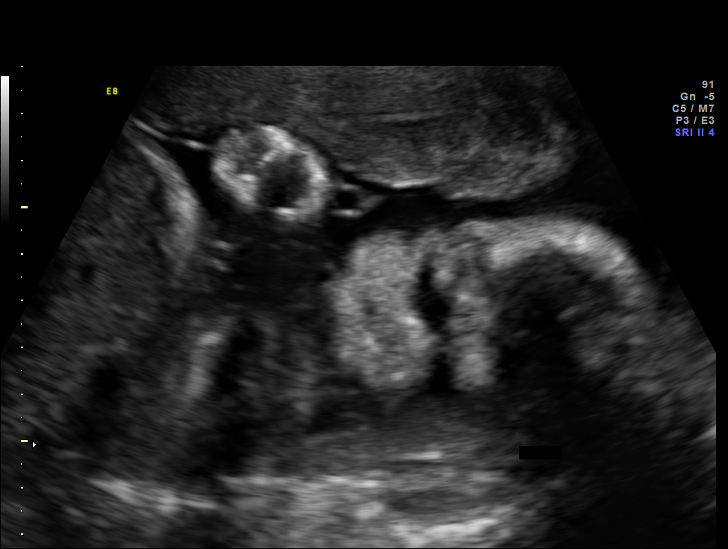
[im 78/88]
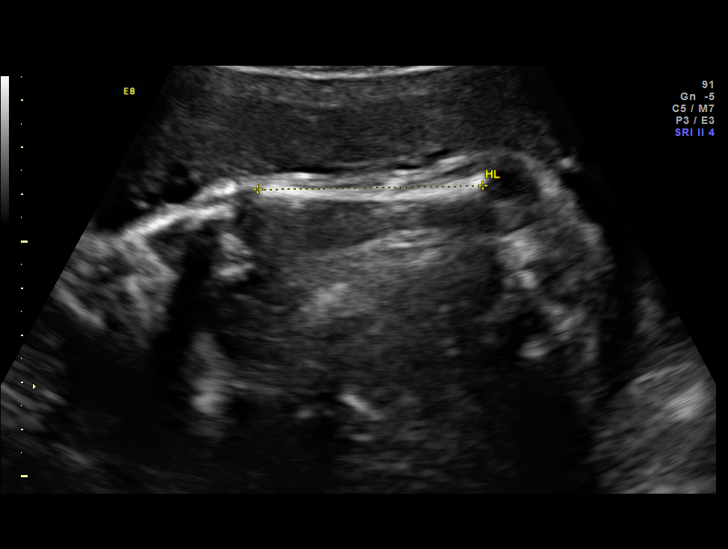
[im 84/88]
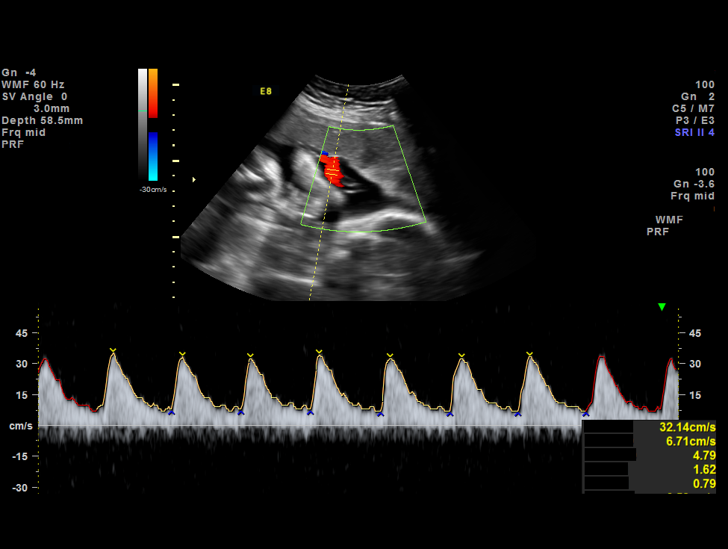

[12 of 28 positions shown; findings below may reference images not displayed]

OBSTETRICS REPORT
                      (Signed Final 09/14/2012 [DATE])

Service(s) Provided

 US OB DETAIL + 14 WK                                  76811.0
 US UA CORD DOPPLER                                    76820.0
Indications

 Placental abruption
 Vaginal bleeding, unknown etiology
 Cigarette smoker
 Size less than dates (Small for gestational [AGE]
 FGR)
 Poor obstetric history: Previous IUFD (stillbirth) (GLU4G.P
 weeks)
Fetal Evaluation

 Num Of Fetuses:    1
 Fetal Heart Rate:  148                          bpm
 Cardiac Activity:  Observed
 Presentation:      Cephalic
 Placenta:          Anterior, above cervical os
 P. Cord            Visualized, central
 Insertion:

 Comment:    Known placental abruption measuring 10.5 x 6.0 x
             cm. [DATE] BPP in 10 mins.

 Amniotic Fluid
 AFI FV:      Subjectively within normal limits
 AFI Sum:     14.83   cm       52  %Tile     Larg Pckt:    5.83  cm
 RUQ:   5.83    cm   RLQ:    3.48   cm    LUQ:   2.9     cm   LLQ:    2.62   cm
Biophysical Evaluation

 Amniotic F.V:   Within normal limits       F. Tone:        Observed
 F. Movement:    Observed                   Score:          [DATE]
 F. Breathing:   Observed
Biometry

 BPD:     77.1  mm     G. Age:  31w 0d                CI:         76.0   70 - 86
 OFD:    101.4  mm                                    FL/HC:      19.0   19.1 -

 HC:     285.6  mm     G. Age:  31w 3d        3  %    HC/AC:      1.14   0.96 -

 AC:     249.9  mm     G. Age:  29w 1d      < 3  %    FL/BPD:     70.6   71 - 87
 FL:      54.4  mm     G. Age:  28w 5d      < 3  %    FL/AC:      21.8   20 - 24
 HUM:     48.1  mm     G. Age:  28w 1d      < 5  %

 Est. FW:    0402  gm      3 lb 1 oz     10  %
Gestational Age

 U/S Today:     30w 0d                                        EDD:   11/23/12
 Best:          32w 3d     Det. By:  Early Ultrasound         EDD:   11/06/12
Anatomy

 Cranium:          Appears normal         Aortic Arch:      Appears normal
 Fetal Cavum:      Appears normal         Ductal Arch:      Appears normal
 Ventricles:       Appears normal         Diaphragm:        Appears normal
 Choroid Plexus:   Appears normal         Stomach:          Appears normal, left
                                                            sided
 Cerebellum:       Appears normal         Abdomen:          Appears normal
 Posterior Fossa:  Appears normal         Abdominal Wall:   Not well visualized
 Nuchal Fold:      Not applicable (>20    Cord Vessels:     Appears normal (3
                   wks GA)                                  vessel cord)
 Face:             Orbits appear          Kidneys:          Appear normal
                   normal
 Lips:             Appears normal         Bladder:          Appears normal
 Heart:            Appears normal         Spine:            Appears normal
                   (4CH, axis, and
                   situs)
 RVOT:             Not well visualized    Lower             Appears normal
                                          Extremities:
 LVOT:             Appears normal         Upper             Appears normal
                                          Extremities:

 Other:  Fetus appears to be a male.Heels visualized. Technically difficult due
         to fetal position. Complete anatomy scan done in office.
Doppler - Fetal Vessels

 Umbilical Artery
 S/D:   5.09       > 97.5  %tile       RI:
 PI:    1.67                           PSV:       36.65   cm/s

Cervix Uterus Adnexa

 Cervix:       Not visualized (advanced GA >34 wks)
Impression

 Single IUP at 32 [DATE] weeks
 Chronic abruption- there appears to be a 5 x 10 cm area of
 formed clot along the lower edge of the placenta
 The estimated fetal weight today is at the 10th %tile.  The AC
 is < 3rd percentile
 Elevated umbilical artery Doppler studies for gestational age.
 No evidence of absent or reversed diastolic flow
 BPP [DATE]
 Normal amniotic fluid volume
Recommendations

 See separate consult.
 Recommend hospital admission for a least daily testing.
 Weekly BPP /UA Dopplers with MFM
 Follow up growth scan in 2 weeks
 Would move toward delivery for AEDF or reversed flow on
 UA Dopplers, non-reassuring fetal testing or poor interval
 growth at next follow up.

 questions or concerns.

## 2013-06-23 IMAGING — US US OB FOLLOW-UP
1 series · 16 of 28 positions shown · non-contrast
Comparison: none

OBSTETRICS REPORT
                      (Signed Final 09/28/2012 [DATE])

Service(s) Provided
 US UA CORD DOPPLER                                    76820.0
 US OB FOLLOW UP                                       76816.1
Indications
 Placental abruption
 Vaginal bleeding, unknown etiology
 Cigarette smoker
 Size less than dates (Small for gestational [AGE]
 FGR)
 Poor obstetric history: Previous IUFD (stillbirth) ([id number]
 weeks)
Fetal Evaluation
 Num Of Fetuses:    1
 Fetal Heart Rate:  165                          bpm
 Cardiac Activity:  Observed
 Presentation:      Cephalic
 Placenta:          Anterior, above cervical os
 P. Cord            Previously Visualized
 Insertion:
 Comment:    Known placental abruption measuring 10.5 x 6.0 x
             cm.
 Amniotic Fluid
 AFI FV:      Subjectively decreased
 AFI Sum:     9.38    cm       15  %Tile     Larg Pckt:    3.86  cm
 RUQ:   3.86    cm   RLQ:    2.11   cm    LUQ:   2.29    cm   LLQ:    1.12   cm
Biophysical Evaluation
 Amniotic F.V:   Within normal limits       F. Tone:        Observed
 F. Movement:    Observed                   Score:          [DATE]
 F. Breathing:   Observed
Biometry
 BPD:     77.2  mm     G. Age:  31w 0d                CI:         74.2   70 - 86
 OFD:      104  mm                                    FL/HC:      19.0   19.4 -
 HC:     292.7  mm     G. Age:  32w 2d      < 3  %    HC/AC:      1.19   0.96 -
 AC:       247  mm     G. Age:  28w 6d      < 3  %    FL/BPD:     71.9   71 - 87
 FL:      55.5  mm     G. Age:  29w 2d      < 3  %    FL/AC:      22.5   20 - 24
 HUM:     49.6  mm     G. Age:  29w 1d      < 5  %
 Est. FW:    5057  gm      3 lb 1 oz   < 10  %
Gestational Age
 U/S Today:     30w 3d                                        EDD:   12/04/12
 Best:          34w 3d     Det. By:  Early Ultrasound         EDD:   11/06/12
Anatomy
 Cranium:          Previously seen        Aortic Arch:      Previously seen
 Fetal Cavum:      Previously seen        Ductal Arch:      Previously seen
 Ventricles:       Previously seen        Diaphragm:        Previously seen
 Choroid Plexus:   Previously seen        Stomach:          Appears normal, left
                                                            sided
 Cerebellum:       Previously seen        Abdomen:          Previously seen
 Posterior Fossa:  Previously seen        Abdominal Wall:   Not well visualized
 Nuchal Fold:      Not applicable (>20    Cord Vessels:     Previously seen
                   wks GA)
 Face:             Orbits and profile     Kidneys:          Appear normal
                   previously seen
 Lips:             Previously seen        Bladder:          Appears normal
 Heart:            Previously seen        Spine:            Previously seen
 RVOT:             Not well visualized    Lower             Previously seen
                                          Extremities:
 LVOT:             Previously seen        Upper             Previously seen
 Other:  Male gender..
Doppler - Fetal Vessels
 Umbilical Artery
 S/D:   5.76       > 97.5  %tile       RI:
 PI:    1.92                           PSV:       32.92   cm/s
 Absent DFV:    Yes
Cervix Uterus Adnexa
 Cervix:       Not visualized (advanced GA >25wks)
Impression
INDICATION: 27 yr old [id number] at 21w2d with chronic
 abruption, fetal growth restriction, abnormal Doppler studies,
 and previous fetal demise for follow up ultrasound.

[Series 1: us ob follow-up · 0.23mm/px · 56 acquisitions, 16 frames shown]
[im 1/56]
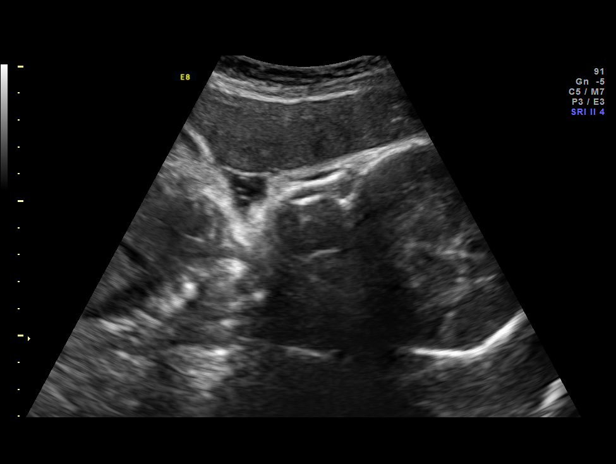
[im 5/56]
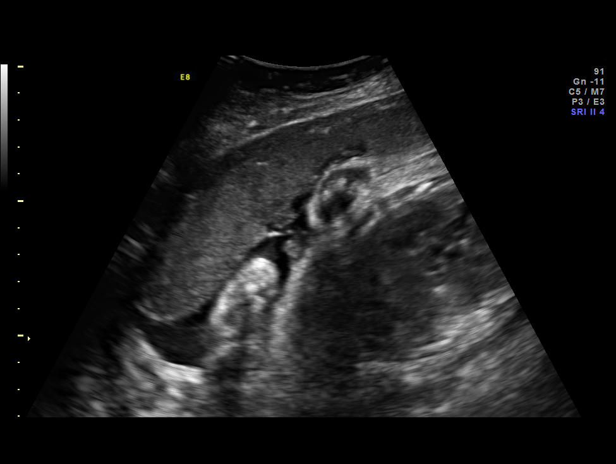
[im 9/56]
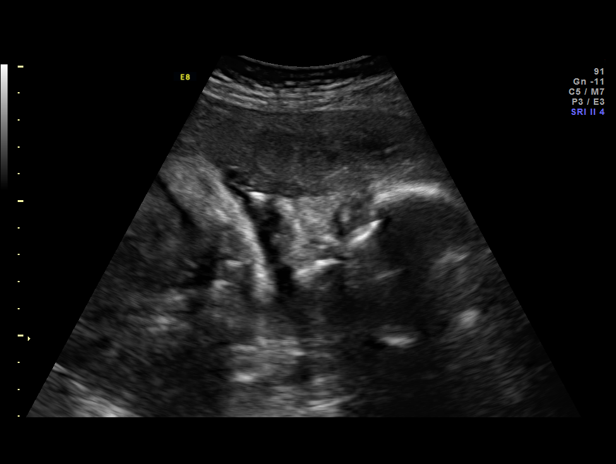
[im 13/56]
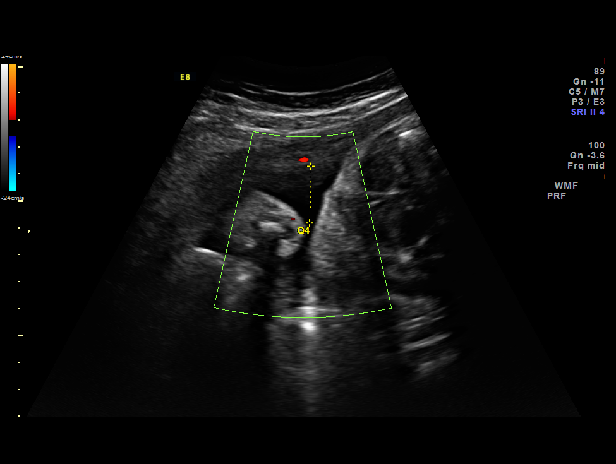
[im 15/56]
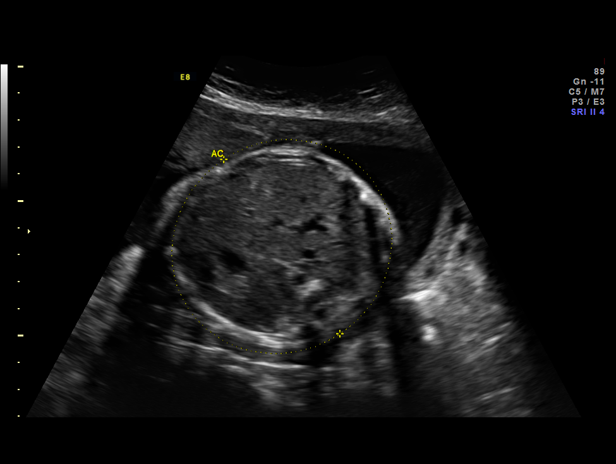
[im 19/56]
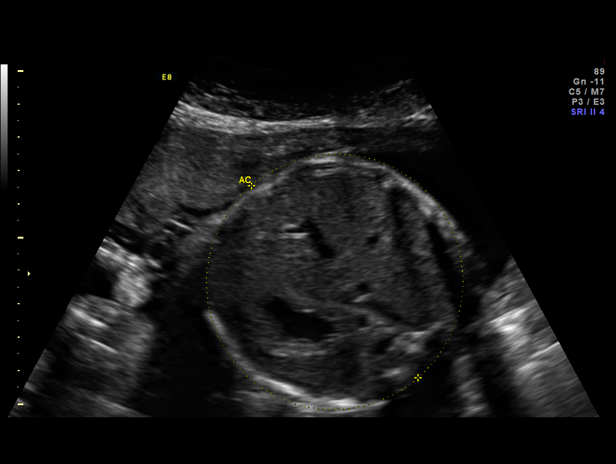
[im 23/56]
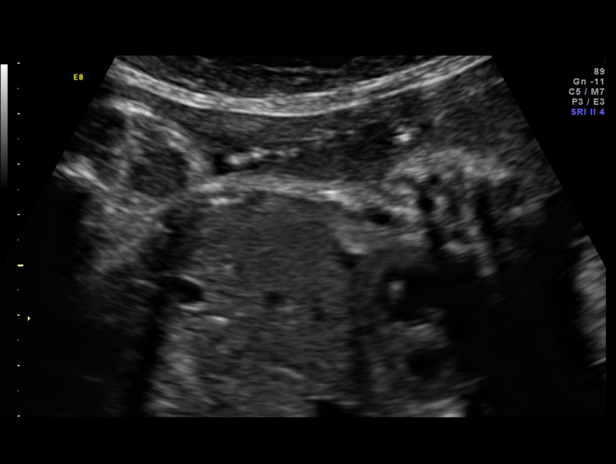
[im 27/56]
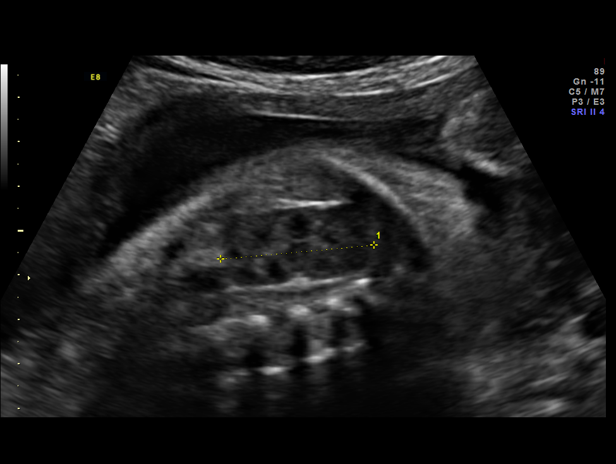
[im 29/56]
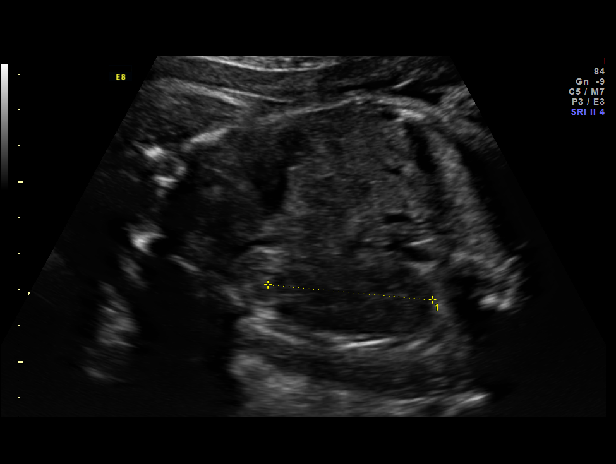
[im 33/56]
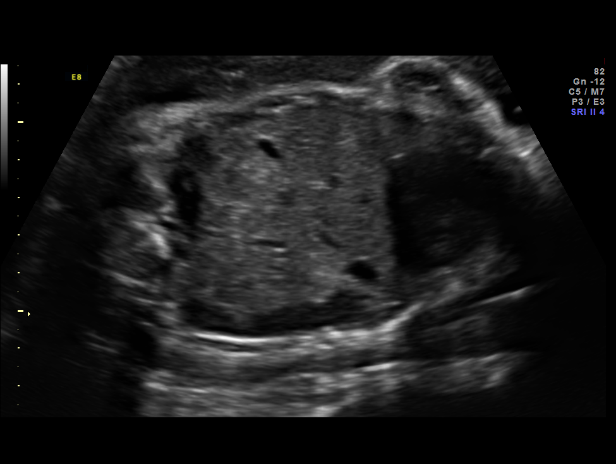
[im 37/56]
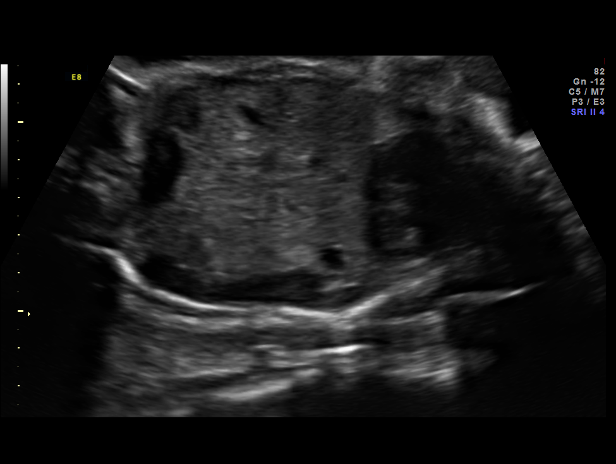
[im 41/56]
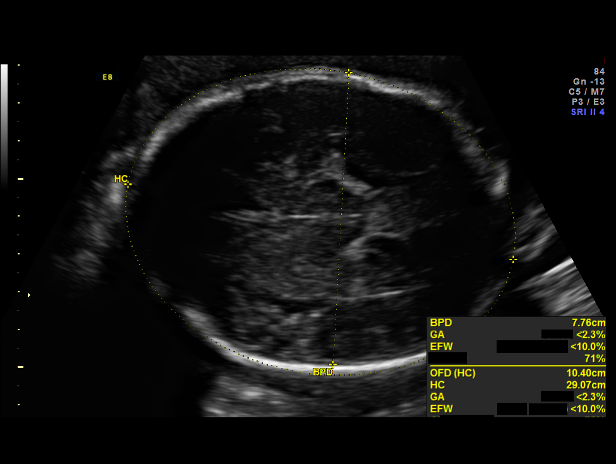
[im 43/56]
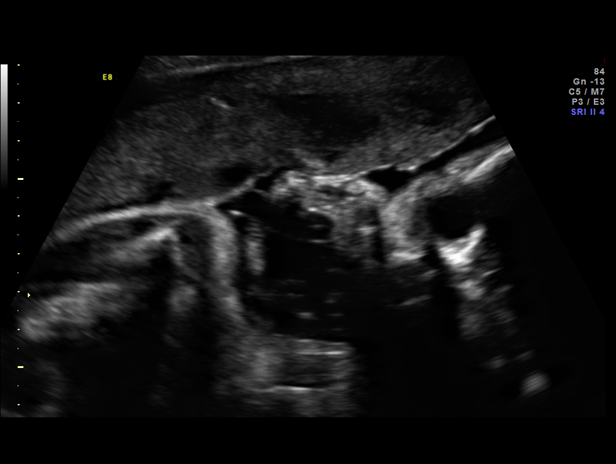
[im 47/56]
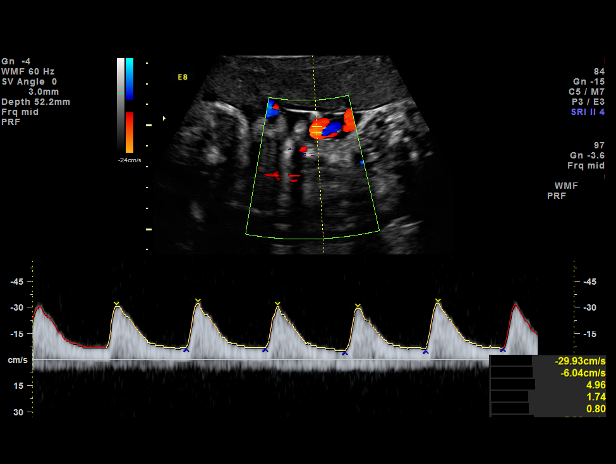
[im 51/56]
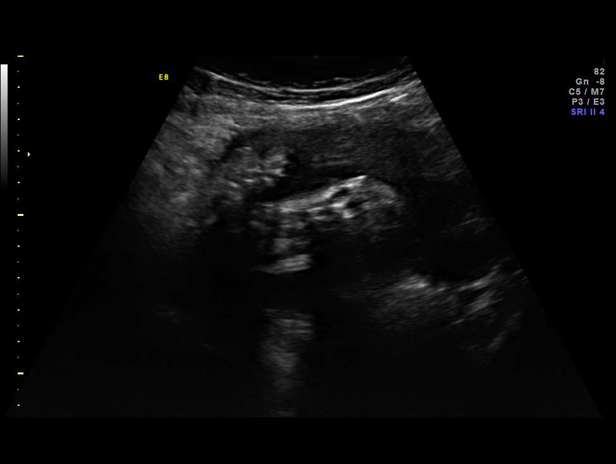
[im 56/56]
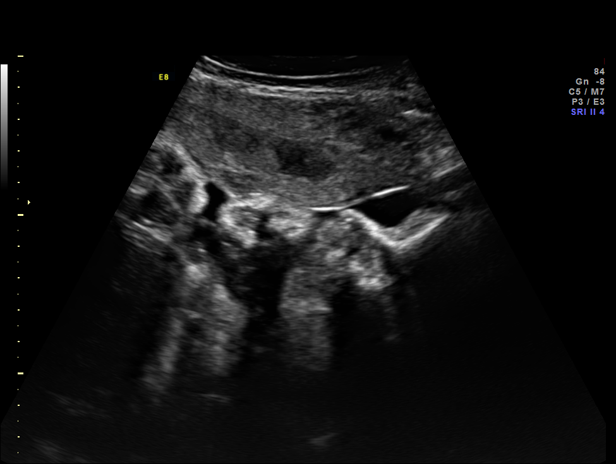

[16 of 28 positions shown; findings below may reference images not displayed]

FINDINGS: 1. Single intrauterine pregnancy.
 2. Estimated fetal weight is in the <10th%. The fetus has
 gained 18g.
 3. Anterior placenta without evidence of previa.
 4. Normal amniotic fluid index; although slightly decreased for
 gestational age.
 5. The limited anatomy survey is normal.
 6. Normal biophysical profile of [DATE].
 7. There is intermittent absent end diastolic flow on umbilical
 artery Doppler studies.
Recommendations

 1. There has been no significant interval fetal growth.
 Given no significant fetal growth and abnormal Doppler
 studies recommend delivery today as risk of stillbirth is
 significantly increased and placental insufficiency is such that
 the fetus is not growing.
 Patient has received 2 courses of [place]
 Discussed may have trial of labor but there is an increased
 risk of fetal intolerance to labor given chronic abruption and
 suspected placental insufficiency and therefore patient is at
 increased risk of needing a C section
 Patient was sent to labor and delivery for delivery.
 2. Previous stillbirth:
 - previously counseled
 - unclear what work up had been done
 - given this history and current pregnancy with fetal growth
 restriction recommend obtain lupus anticoagulant,
 anticardiolipin antibodies, and beta 2 glycoprotein
 - also if patient has future pregnancies recommend starting
 low dose aspirin 81mg/day in the first trimester
 3. Recommend submit placenta to Pathology
 4. Recommend continuous fetal monitoring and NICU for
 delivery

 Discussed with Dr. Rudi

 questions or concerns.

## 2014-06-09 ENCOUNTER — Encounter (HOSPITAL_COMMUNITY): Payer: Self-pay | Admitting: *Deleted

## 2015-09-11 ENCOUNTER — Encounter (HOSPITAL_COMMUNITY): Payer: Self-pay | Admitting: Family Medicine

## 2015-09-11 ENCOUNTER — Emergency Department (HOSPITAL_COMMUNITY)
Admission: EM | Admit: 2015-09-11 | Discharge: 2015-09-11 | Disposition: A | Discharge status: Home / Self Care | Payer: Medicaid Other | Attending: Emergency Medicine | Admitting: Emergency Medicine

## 2015-09-11 DIAGNOSIS — R0981 Nasal congestion: Secondary | ICD-10-CM | POA: Diagnosis present

## 2015-09-11 DIAGNOSIS — J0110 Acute frontal sinusitis, unspecified: Secondary | ICD-10-CM | POA: Insufficient documentation

## 2015-09-11 DIAGNOSIS — Z79899 Other long term (current) drug therapy: Secondary | ICD-10-CM | POA: Diagnosis not present

## 2015-09-11 DIAGNOSIS — F17210 Nicotine dependence, cigarettes, uncomplicated: Secondary | ICD-10-CM | POA: Diagnosis not present

## 2015-09-11 DIAGNOSIS — H9203 Otalgia, bilateral: Secondary | ICD-10-CM | POA: Diagnosis not present

## 2015-09-11 MED ORDER — AMOXICILLIN 500 MG PO CAPS
1000.0000 mg | ORAL_CAPSULE | Freq: Once | ORAL | Status: AC
  Administered 2015-09-11: 1000 mg via ORAL
  Filled 2015-09-11: qty 2

## 2015-09-11 MED ORDER — AMOXICILLIN 500 MG PO CAPS: 1000.0000 mg | ORAL_CAPSULE | Freq: Two times a day (BID) | ORAL | Status: DC

## 2015-09-11 MED ORDER — FLUCONAZOLE 150 MG PO TABS: 150.0000 mg | ORAL_TABLET | Freq: Every day | ORAL | Status: DC

## 2015-09-11 NOTE — ED Notes (Signed)
Pt here for cough, congestion, and fullness in ears

## 2015-09-11 NOTE — ED Notes (Signed)
Pt A&OX4, ambulatory at d/c with steady gait, NAD, and states she has all of her belongings with her at d/c

## 2015-09-11 NOTE — Discharge Instructions (Signed)
Use nasal saline (you can try Arm and Hammer Simply Saline) at least 4 times a day, use saline 5-10 minutes before using the fluticasone (flonase) nasal spray  Do not use Afrin (Oxymetazoline)  Rest, wash hands frequently  and drink plenty of water.  You may try counter medication such as Mucinex or Sudafed decongestant.  For pain control you may take:  800mg  of ibuprofen (that is usually 4 over the counter pills)  3 times a day (take with food) and acetaminophen 975mg  (this is 3 over the counter pills) four times a day. Do not drink alcohol or combine with other medications that have acetaminophen as an ingredient (Read the labels!).    Do not hesitate to return to the emergency room for any new, worsening or concerning symptoms.  Please obtain primary care using resource guide below. Let them know that you were seen in the emergency room and that they will need to obtain records for further outpatient management.    Sinusitis, Adult Sinusitis is redness, soreness, and inflammation of the paranasal sinuses. Paranasal sinuses are air pockets within the bones of your face. They are located beneath your eyes, in the middle of your forehead, and above your eyes. In healthy paranasal sinuses, mucus is able to drain out, and air is able to circulate through them by way of your nose. However, when your paranasal sinuses are inflamed, mucus and air can become trapped. This can allow bacteria and other germs to grow and cause infection. Sinusitis can develop quickly and last only a short time (acute) or continue over a long period (chronic). Sinusitis that lasts for more than 12 weeks is considered chronic. CAUSES Causes of sinusitis include:  Allergies.  Structural abnormalities, such as displacement of the cartilage that separates your nostrils (deviated septum), which can decrease the air flow through your nose and sinuses and affect sinus drainage.  Functional abnormalities, such as when the  small hairs (cilia) that line your sinuses and help remove mucus do not work properly or are not present. SIGNS AND SYMPTOMS Symptoms of acute and chronic sinusitis are the same. The primary symptoms are pain and pressure around the affected sinuses. Other symptoms include:  Upper toothache.  Earache.  Headache.  Bad breath.  Decreased sense of smell and taste.  A cough, which worsens when you are lying flat.  Fatigue.  Fever.  Thick drainage from your nose, which often is green and may contain pus (purulent).  Swelling and warmth over the affected sinuses. DIAGNOSIS Your health care provider will perform a physical exam. During your exam, your health care provider may perform any of the following to help determine if you have acute sinusitis or chronic sinusitis:  Look in your nose for signs of abnormal growths in your nostrils (nasal polyps).  Tap over the affected sinus to check for signs of infection.  View the inside of your sinuses using an imaging device that has a light attached (endoscope). If your health care provider suspects that you have chronic sinusitis, one or more of the following tests may be recommended:  Allergy tests.  Nasal culture. A sample of mucus is taken from your nose, sent to a lab, and screened for bacteria.  Nasal cytology. A sample of mucus is taken from your nose and examined by your health care provider to determine if your sinusitis is related to an allergy. TREATMENT Most cases of acute sinusitis are related to a viral infection and will resolve on their own within  10 days. Sometimes, medicines are prescribed to help relieve symptoms of both acute and chronic sinusitis. These may include pain medicines, decongestants, nasal steroid sprays, or saline sprays. However, for sinusitis related to a bacterial infection, your health care provider will prescribe antibiotic medicines. These are medicines that will help kill the bacteria causing the  infection. Rarely, sinusitis is caused by a fungal infection. In these cases, your health care provider will prescribe antifungal medicine. For some cases of chronic sinusitis, surgery is needed. Generally, these are cases in which sinusitis recurs more than 3 times per year, despite other treatments. HOME CARE INSTRUCTIONS  Drink plenty of water. Water helps thin the mucus so your sinuses can drain more easily.  Use a humidifier.  Inhale steam 3-4 times a day (for example, sit in the bathroom with the shower running).  Apply a warm, moist washcloth to your face 3-4 times a day, or as directed by your health care provider.  Use saline nasal sprays to help moisten and clean your sinuses.  Take medicines only as directed by your health care provider.  If you were prescribed either an antibiotic or antifungal medicine, finish it all even if you start to feel better. SEEK IMMEDIATE MEDICAL CARE IF:  You have increasing pain or severe headaches.  You have nausea, vomiting, or drowsiness.  You have swelling around your face.  You have vision problems.  You have a stiff neck.  You have difficulty breathing.   This information is not intended to replace advice given to you by your health care provider. Make sure you discuss any questions you have with your health care provider.   Document Released: 07/25/2005 Document Revised: 08/15/2014 Document Reviewed: 08/09/2011 Elsevier Interactive Patient Education 2016 [employer].  Emergency Department Resource Guide 1) Find a Doctor and Pay Out of Pocket Although you won't have to find out who is covered by your insurance plan, it is a good idea to ask around and get recommendations. You will then need to call the office and see if the doctor you have chosen will accept you as a new patient and what types of options they offer for patients who are self-pay. Some doctors offer discounts or will set up payment plans for their patients who do  not have insurance, but you will need to ask so you aren't surprised when you get to your appointment.  2) Contact Your Local Health Department Not all health departments have doctors that can see patients for sick visits, but many do, so it is worth a call to see if yours does. If you don't know where your local health department is, you can check in your phone book. The CDC also has a tool to help you locate your state's health department, and many state websites also have listings of all of their local health departments.  3) Find a Walk-in Clinic If your illness is not likely to be very severe or complicated, you may want to try a walk in clinic. These are popping up all over the country in pharmacies, drugstores, and shopping centers. They're usually staffed by nurse practitioners or physician assistants that have been trained to treat common illnesses and complaints. They're usually fairly quick and inexpensive. However, if you have serious medical issues or chronic medical problems, these are probably not your best option.  No Primary Care Doctor: - Call Health Connect at  [phone] - they can help you locate a primary care doctor that  accepts your insurance, provides  certain services, etc. - Physician Referral Service- [phone]  Chronic Pain Problems: Organization         Address  Phone   Notes  [facility]  [phone] Patients need to be referred by their primary care doctor.   Medication Assistance: Organization         Address  Phone   Notes  [facility] [address] [place] [place], [place] [address] [phone] --Must be a resident of [facility] -- Must have NO insurance coverage whatsoever (no Medicaid/ Medicare, etc.) -- The pt. MUST have a primary care doctor that directs their care regularly and follows them in the community   [facility]  [phone]   [employer]  [phone]    Agencies that  provide inexpensive medical care: Organization         Address  Phone   Notes  [facility]  [phone]   [facility]    [phone]   [facility] [address] [place], [place] [address] [phone]   [facility] [address] [place] [place] [phone]   Planned Parenthood    [phone]   [facility]    [phone]   [facility]  [address], [place] Phone:  [phone], Fax:  [phone] Hours of Operation:  9 am - 6 pm, M-F.  Also accepts Medicaid/Medicare and self-pay.  [facility]  [address], [place] Phone: [phone], Fax: [phone]. Hours of Operation:  8:30 am - 5:30 pm, M-F.  Also accepts Medicaid and self-pay.  [facility] [place] [address] [place] Phone: [phone]   [facility] [address] Natasha Bence [place], [place] [phone], Ext. [phone] Mondays & Thursdays: 7-9 AM.  First 15 patients are seen on a first come, first serve basis.    Medicaid-accepting [facility] Providers:  Organization         Address  Phone   Notes  [facility] [address],  [phone] Also accepts self-pay patients.  [facility] [address] Laurell Josephs [place], [place]  [phone]   [facility] [address], [place] [phone]   [facility] [address] [place] [phone]   Renaye Rakers [address], [place]   [phone] Only accepts [place] patients after they have their name applied to their card.   Self-Pay (no insurance) in [facility]:  Organization         Address  Phone   Notes  Sickle Cell Patients, [facility] [address] [place], [place] [phone]   [facility]  Urgent Care [address] [place], [place] [phone]   [facility]  [address], [place] [phone]   [facility]/Dr. Osei-Bonsu  [address] [place] or [address], [place] [phone] Phone number for both [place] and [place] locations is the same.  [facility] [address] [place] [phone]   [facility] [address], [place] or [address] [phone]   [facility] [address]  [address] [place] [phone], phone; [phone], fax Sees patients 1st and 3rd Saturday of every month.  Must not qualify for public or private insurance (i.e. Medicaid, Medicare, [place] Health Choice, Veterans' Benefits)  Household income should be no more than 200% of the poverty level The clinic cannot treat you if you are pregnant or think you are pregnant  Sexually transmitted diseases are not treated at the clinic.    Dental Care: Organization         Address  Phone  Notes  [facility] [address] [place], [place] [phone] Accepts children up to age 61 who are enrolled in [place] or [place] Health Choice; pregnant women with a Medicaid card; and children who have applied for Medicaid or [place] Health Choice, but were declined, whose parents can pay a reduced fee at time of service.  [facility] Department of [facility]  [address], [place] [phone] Accepts children up to age 30 who are enrolled in [place] or [place] Health Choice; pregnant women with a Medicaid card; and children who have applied for Medicaid or [place] Health Choice, but were declined, whose parents can pay a reduced fee at time of service.  [facility]  [address] [place], [place] [phone] Patients are seen by appointment only. Walk-ins  are not accepted. [facility] will see patients 64 years of age and older. Monday - Tuesday (8am-5pm) Most Wednesdays (8:30-5pm) $30 per visit, cash only  [facility]  [address], [facility] [phone] Patients are seen by appointment only. Walk-ins are not accepted. [facility] will see patients 69 years of age and older. One Wednesday Evening (Monthly: Volunteer Based).  $30 per visit, cash only  [employer]  [phone] for adults; Children under age 76, call Graduate Pediatric Dentistry at [phone]. Children aged 77-14, please call [phone] to request a pediatric application.  Dental services are provided in all areas of dental care including fillings, crowns and bridges, complete and partial dentures, implants, gum treatment, root canals, and extractions. Preventive care is also provided. Treatment is provided to both adults and children. Patients are selected via a lottery and there is often a waiting list.   [facility] [address] [place]  [phone] [email or web address]   [facility] [address] [place], [place] [phone], Ext. [phone] Second and Fourth Thursday of each month, opens at 6:30 AM; Clinic ends at 9 AM.  Patients are seen on a first-come first-served basis, and a limited number are seen during each clinic.   [facility]  [address] Ether Griffins [place], [place] [phone]   Eligibility Requirements You must have lived in [place], [place], or [place] counties for at least the last three months.   You cannot be eligible for state or federal sponsored [place], including [employer], [place], or [employer].   You generally cannot be eligible for healthcare insurance through your employer.    How to apply: Eligibility screenings are held every Tuesday and Wednesday afternoon from 1:00 pm until 4:00 pm. You do not need an appointment  for the interview!  [facility] [address] [address], [place], [place] [phone]   [facility]  [phone]   [facility]  [phone]   [facility] Department  [phone]  Behavioral Health Resources in the Community: Intensive Outpatient Programs Organization         Address  Phone  Notes  [facility] [address], [place], [place] [phone]   [facility] [address], [place], [place] [phone]   [facility] [address], [place], [place]  [phone]   [facility] [address],  [place], [place] [phone] or [phone]   Substance Abuse Resources Organization         Address  Phone  Notes  [facility]  [phone]   [facility]  [phone]   [facility]  [phone]   [facility]  [phone]   Residential & Outpatient Substance Abuse Program  [phone]   Psychological Services Organization         Address  Phone  Notes  [facility]  [phone]- [phone]   [facility]  [phone]- [phone]   [facility] [address], [place] [phone] or [phone]    Mobile Crisis Teams Organization         Address  Phone  Notes  [facility], Mobile Crisis Care Unit  [phone]   [facility]  [address] [place], [place] [phone]   Doristine LocksSharon DeEsch [address] [place] [phone]    Self-Help/Support Groups Organization         Address  Phone             Notes  [facility] [place] - variety of support groups  [phone] Call for more information  Narcotics Anonymous (NA), [facility] [address], [place]  2 meetings at this location   Statisticianesidential Treatment Programs Organization         Address  Phone  Notes  [facility] [address],    [place]  [phone]   [facility]  [address], [place] [phone], [place], [place] [phone]   [facility] [address], [place] [phone] Admissions: 8am-3pm M-F  [facility] [address]    [place], [place] [phone]   [facility] [address], [place], [place] [phone]   [facility] [address]  [place], [place] [phone]   [facility] - Intensive Outpatient [address], [place], [place] [phone]   [facility] [address]  [place], [place] [phone] or [phone]   [facility] ([facility]) [address] [place], [place] [phone] Accepts Medicaid  [facility] [address]  [place] [phone] Substance Abuse/Addiction Treatment   [facility] Resources Organization         Address  Phone  Notes  [facility]  [phone]   Angie FavaJulie Brannon, PhD [address], [address] [place], [place]   [phone] or [phone]   [facility]   [address] [place], [place] [phone]   [facility] [address], [place], [place] [phone] Insurance/Medicaid/sponsorship through [employer] [facility]  [facility] [address]                                    [place], [place] [phone] Therapy/tele-psych/case  [facility] [address]   [place],  [place] [phone]    Dr. Lolly Mustache  [phone]   [facility]  [employer] [facility] 1) [address] [place] 2) [address] [place] [address], [place] 3)  [address], [place] [phone]  [phone]   [facility] Child Abuse Hotline [phone] or [phone] (After Hours)

## 2015-09-11 NOTE — ED Provider Notes (Signed)
CSN: [id number]     Arrival date & time 09/11/15  1820 History   By signing my name below, I, Alexis Duarte, attest that this documentation has been prepared under the direction and in the presence of non-physician practitioner, Alexis Emery, PA-C. Electronically Signed: Freida Duarte, Scribe. 09/11/2015. 6:46 PM.    Chief Complaint  Patient presents with  . Otalgia  . Nasal Congestion    The history is provided by the patient. No language interpreter was used.     HPI Comments:  Alexis Duarte is a 30 y.o. female who presents to the Emergency Department complaining of 8/10 bilateral ear pain x 2 days. She describes her symptom as having "bubbles" in her ear. She reports associated congestion x 1 month, rhinorrhea and sore throat. She denies fever. She has been taking Robitussin with minimal relief.    No PCP   Past Medical History  Diagnosis Date  . Headache(784.0)   . Fetal demise   . Complication of anesthesia 07/13/12    nerve tweaked 2 post epid cath   Past Surgical History  Procedure Laterality Date  . Stitches to head as a child     Family History  Problem Relation Age of Onset  . Hypertension Mother   . Diabetes Mother   . Heart disease Mother   . Cancer Father   . Diabetes Father   . Hypertension Father    Social History  Substance Use Topics  . Smoking status: Current Every Day Smoker -- 0.50 packs/day for 4 years    Types: Cigarettes  . Smokeless tobacco: None  . Alcohol Use: No   OB History    Gravida Para Term Preterm AB TAB SAB Ectopic Multiple Living   Review of Systems  10 systems reviewed and all are negative for acute change except as noted in the HPI.   Allergies  Stadol and Zithromax  Home Medications   Prior to Admission medications   Medication Sig Start Date End Date Taking? Authorizing Provider  docusate sodium (COLACE) 100 MG capsule Take 1 capsule (100 mg total) by mouth 2 (two) times daily. 09/30/12   Waynard Reeds, MD  HYDROcodone-acetaminophen (VICODIN) 5-500 MG per tablet Take 1 tablet by mouth every 4 (four) hours as needed for pain. 09/30/12   Waynard Reeds, MD  ibuprofen (ADVIL,MOTRIN) 600 MG tablet Take 1 tablet (600 mg total) by mouth every 6 (six) hours as needed for pain. 09/30/12   Waynard Reeds, MD  Prenatal Vit-Fe Fumarate-FA (PRENATAL MULTIVITAMIN) TABS Take 1 tablet by mouth at bedtime.     Historical Provider, MD   BP 144/95 mmHg  Pulse 84  Temp(Src) 98.4 F (36.9 C)  Resp 18  SpO2 98%  LMP 09/06/2015 Physical Exam  Constitutional: She is oriented to person, place, and time. She appears well-developed and well-nourished. No distress.  HENT:  Head: Normocephalic and atraumatic.  Right Ear: External ear normal.  Left Ear: External ear normal.  Mouth/Throat: Oropharynx is clear and moist. No oropharyngeal exudate.  No drooling or stridor. Posterior pharynx mildly erythematous no significant tonsillar hypertrophy. No exudate. Soft palate rises symmetrically. No TTP or induration under tongue.   Mild tenderness to palpation of frontal and maxillary sinuses. Dulled reflex in the right tympanic membrane with erythema and no bulging.   Mild mucosal edema in the nares with scant rhinorrhea.  Bilateral tympanic membranes with normal architecture and good light reflex.  Eyes: Conjunctivae and EOM are normal. Pupils are equal, round, and reactive to light.  Neck: Normal range of motion. Neck supple.  Cardiovascular: Normal rate and regular rhythm.   Pulmonary/Chest: Effort normal and breath sounds normal. No stridor. No respiratory distress. She has no wheezes. She has no rales. She exhibits no tenderness.  Abdominal: Soft. She exhibits no distension. There is no tenderness. There is no rebound and no guarding.  Neurological: She is alert and oriented to person, place, and time.  Skin: Skin is warm and dry.  Psychiatric: She has a normal mood and affect.  Nursing note and vitals  reviewed.   ED Course  Procedures   DIAGNOSTIC STUDIES:  Oxygen Saturation is 98% on RA, normal by my interpretation.    COORDINATION OF CARE:  6:45 PM Discussed treatment plan with pt at bedside and pt agreed to plan.    MDM   Final diagnoses:  Acute frontal sinusitis, recurrence not specified    Filed Vitals:   09/11/15 1830  BP: 144/95  Pulse: 84  Temp: 98.4 F (36.9 C)  Resp: 18  SpO2: 98%    Medications  amoxicillin (AMOXIL) capsule 1,000 mg (1,000 mg Oral Given 09/11/15 1850)    Alexis Duarte is 30 y.o. female presenting with with rhinorrhea, nasal congestion and otalgia. Physical exam systems consistent with acute otitis media, states that this is been going on for several weeks, will start her on amoxicillin and recommend Flonase. Patient is given prescription for fluconazole for yeast infection at her request.  Evaluation does not show pathology that would require ongoing emergent intervention or inpatient treatment. Pt is hemodynamically stable and mentating appropriately. Discussed findings and plan with patient/guardian, who agrees with care plan. All questions answered. Return precautions discussed and outpatient follow up given.   Discharge Medication List as of 09/11/2015  6:47 PM    START taking these medications   Details  amoxicillin (AMOXIL) 500 MG capsule Take 2 capsules (1,000 mg total) by mouth 2 (two) times daily., Starting 09/11/2015, Until Discontinued, Print    fluconazole (DIFLUCAN) 150 MG tablet Take 1 tablet (150 mg total) by mouth daily. Can repeat in 3 days if symptoms persist, Starting 09/11/2015, Until Discontinued, Print        I personally performed the services described in this documentation, which was scribed in my presence. The recorded information has been reviewed and is accurate.     Alexis Emery, PA-C 09/11/15 2230  Alexis Pulley, MD 09/12/15 [phone]

## 2015-09-11 NOTE — ED Notes (Signed)
See NPs notes for secondary assessments. 

## 2016-10-19 DIAGNOSIS — H40033 Anatomical narrow angle, bilateral: Secondary | ICD-10-CM | POA: Diagnosis not present

## 2016-10-19 DIAGNOSIS — G44219 Episodic tension-type headache, not intractable: Secondary | ICD-10-CM | POA: Diagnosis not present

## 2020-10-06 DIAGNOSIS — S93492A Sprain of other ligament of left ankle, initial encounter: Secondary | ICD-10-CM | POA: Diagnosis not present

## 2020-10-06 DIAGNOSIS — S93402A Sprain of unspecified ligament of left ankle, initial encounter: Secondary | ICD-10-CM | POA: Diagnosis not present

## 2020-10-06 DIAGNOSIS — M7989 Other specified soft tissue disorders: Secondary | ICD-10-CM | POA: Diagnosis not present

## 2020-10-06 DIAGNOSIS — M25572 Pain in left ankle and joints of left foot: Secondary | ICD-10-CM | POA: Diagnosis not present

## 2020-10-06 DIAGNOSIS — G8911 Acute pain due to trauma: Secondary | ICD-10-CM | POA: Diagnosis not present

## 2021-05-02 DIAGNOSIS — Z20822 Contact with and (suspected) exposure to covid-19: Secondary | ICD-10-CM | POA: Diagnosis not present

## 2021-05-02 DIAGNOSIS — Z883 Allergy status to other anti-infective agents status: Secondary | ICD-10-CM | POA: Diagnosis not present

## 2021-05-02 DIAGNOSIS — J019 Acute sinusitis, unspecified: Secondary | ICD-10-CM | POA: Diagnosis not present

## 2021-05-02 DIAGNOSIS — Z79899 Other long term (current) drug therapy: Secondary | ICD-10-CM | POA: Diagnosis not present

## 2021-05-02 DIAGNOSIS — Z888 Allergy status to other drugs, medicaments and biological substances status: Secondary | ICD-10-CM | POA: Diagnosis not present

## 2021-05-02 DIAGNOSIS — H9203 Otalgia, bilateral: Secondary | ICD-10-CM | POA: Diagnosis not present

## 2021-05-02 DIAGNOSIS — F17200 Nicotine dependence, unspecified, uncomplicated: Secondary | ICD-10-CM | POA: Diagnosis not present

## 2021-05-02 DIAGNOSIS — H60502 Unspecified acute noninfective otitis externa, left ear: Secondary | ICD-10-CM | POA: Diagnosis not present

## 2021-05-02 DIAGNOSIS — J0140 Acute pansinusitis, unspecified: Secondary | ICD-10-CM | POA: Diagnosis not present

## 2024-01-24 ENCOUNTER — Emergency Department (HOSPITAL_BASED_OUTPATIENT_CLINIC_OR_DEPARTMENT_OTHER)

## 2024-01-24 ENCOUNTER — Emergency Department (HOSPITAL_BASED_OUTPATIENT_CLINIC_OR_DEPARTMENT_OTHER): Admission: EM | Admit: 2024-01-24 | Discharge: 2024-01-24 | Disposition: A | Discharge status: Home / Self Care

## 2024-01-24 DIAGNOSIS — R109 Unspecified abdominal pain: Secondary | ICD-10-CM | POA: Diagnosis present

## 2024-01-24 DIAGNOSIS — R1013 Epigastric pain: Secondary | ICD-10-CM | POA: Diagnosis not present

## 2024-01-24 DIAGNOSIS — D735 Infarction of spleen: Secondary | ICD-10-CM

## 2024-01-24 DIAGNOSIS — R1012 Left upper quadrant pain: Secondary | ICD-10-CM | POA: Diagnosis not present

## 2024-01-24 DIAGNOSIS — I748 Embolism and thrombosis of other arteries: Secondary | ICD-10-CM

## 2024-01-24 LAB — COMPREHENSIVE METABOLIC PANEL WITH GFR
ALT: 19 U/L (ref 0–44)
AST: 18 U/L (ref 15–41)
Albumin: 4.8 g/dL (ref 3.5–5.0)
Alkaline Phosphatase: 49 U/L (ref 38–126)
Anion gap: 15 (ref 5–15)
BUN: 10 mg/dL (ref 6–20)
CO2: 22 mmol/L (ref 22–32)
Calcium: 10.5 mg/dL — ABNORMAL HIGH (ref 8.9–10.3)
Chloride: 101 mmol/L (ref 98–111)
Creatinine, Ser: 0.66 mg/dL (ref 0.44–1.00)
GFR, Estimated: >60 mL/min (ref >60)
Glucose, Bld: 103 mg/dL — ABNORMAL HIGH (ref 70–99)
Potassium: 4.2 mmol/L (ref 3.5–5.1)
Sodium: 137 mmol/L (ref 135–145)
Total Bilirubin: 0.4 mg/dL (ref 0.0–1.2)
Total Protein: 7.8 g/dL (ref 6.5–8.1)

## 2024-01-24 LAB — CBC
HCT: 44.4 % (ref 36.0–46.0)
Hemoglobin: 15.4 g/dL — ABNORMAL HIGH (ref 12.0–15.0)
MCH: 31.9 pg (ref 26.0–34.0)
MCHC: 34.7 g/dL (ref 30.0–36.0)
MCV: 91.9 fL (ref 80.0–100.0)
Platelets: 256 10*3/uL (ref 150–400)
RBC: 4.83 MIL/uL (ref 3.87–5.11)
RDW: 12.5 % (ref 11.5–15.5)
WBC: 18.7 10*3/uL — ABNORMAL HIGH (ref 4.0–10.5)
nRBC: 0 % (ref 0.0–0.2)

## 2024-01-24 LAB — URINALYSIS, ROUTINE W REFLEX MICROSCOPIC
Bilirubin Urine: NEGATIVE
Glucose, UA: NEGATIVE mg/dL
Hgb urine dipstick: NEGATIVE
Ketones, ur: NEGATIVE mg/dL
Leukocytes,Ua: NEGATIVE
Nitrite: NEGATIVE
Specific Gravity, Urine: 1.023 (ref 1.005–1.030)
pH: 7 (ref 5.0–8.0)

## 2024-01-24 LAB — LIPASE, BLOOD: Lipase: 29 U/L (ref 11–51)

## 2024-01-24 LAB — PREGNANCY, URINE: Preg Test, Ur: NEGATIVE

## 2024-01-24 MED ORDER — HYDROMORPHONE HCL 1 MG/ML IJ SOLN
0.5000 mg | Freq: Once | INTRAMUSCULAR | Status: AC
  Administered 2024-01-24: 0.5 mg via INTRAVENOUS
  Filled 2024-01-24: qty 1

## 2024-01-24 MED ORDER — LACTATED RINGERS IV BOLUS
1000.0000 mL | Freq: Once | INTRAVENOUS | Status: AC
  Administered 2024-01-24: 1000 mL via INTRAVENOUS

## 2024-01-24 MED ORDER — HYDROCODONE-ACETAMINOPHEN 5-325 MG PO TABS: 1.0000 | ORAL_TABLET | ORAL | 0 refills | Status: AC | PRN

## 2024-01-24 MED ORDER — APIXABAN 2.5 MG PO TABS
10.0000 mg | ORAL_TABLET | Freq: Once | ORAL | Status: AC
  Administered 2024-01-24: 10 mg via ORAL
  Filled 2024-01-24: qty 4

## 2024-01-24 MED ORDER — ALUM & MAG HYDROXIDE-SIMETH 200-200-20 MG/5ML PO SUSP
30.0000 mL | Freq: Once | ORAL | Status: AC
  Administered 2024-01-24: 30 mL via ORAL
  Filled 2024-01-24: qty 30

## 2024-01-24 MED ORDER — SUCRALFATE 1 G PO TABS
1.0000 g | ORAL_TABLET | Freq: Once | ORAL | Status: AC
  Administered 2024-01-24: 1 g via ORAL
  Filled 2024-01-24: qty 1

## 2024-01-24 MED ORDER — APIXABAN (ELIQUIS) EDUCATION KIT FOR DVT/PE PATIENTS: PACK | Freq: Once | Status: AC

## 2024-01-24 MED ORDER — APIXABAN (ELIQUIS) VTE STARTER PACK (10MG AND 5MG): ORAL_TABLET | ORAL | 0 refills | Status: AC

## 2024-01-24 MED ORDER — ONDANSETRON HCL 4 MG/2ML IJ SOLN
4.0000 mg | Freq: Once | INTRAMUSCULAR | Status: AC
  Administered 2024-01-24: 4 mg via INTRAVENOUS
  Filled 2024-01-24: qty 2

## 2024-01-24 MED ORDER — SODIUM CHLORIDE 0.9 % IV BOLUS
500.0000 mL | Freq: Once | INTRAVENOUS | Status: AC
  Administered 2024-01-24: 500 mL via INTRAVENOUS

## 2024-01-24 MED ORDER — IOHEXOL 350 MG/ML SOLN
100.0000 mL | Freq: Once | INTRAVENOUS | Status: AC | PRN
  Administered 2024-01-24: 100 mL via INTRAVENOUS

## 2024-01-24 NOTE — ED Provider Notes (Signed)
 [facility] AT [facility] Provider Note   CSN: [id number] Arrival date & time: 01/24/24  1036     Patient presents with: Abdominal Pain   Alexis Duarte is a 38 y.o. female.   38 year old female presents for evaluation of abdominal pain.  She states this started at 330 this morning.  States it is primarily in the upper region and radiates to her back.  She states she is on Protonix for acid reflux and stomach issues.  Patient admits to some nausea and vomiting and diarrhea that just started this morning as well.  She denies any alcohol or drug use and denies any other symptoms or concerns at this time.   Abdominal Pain Associated symptoms: nausea and vomiting   Associated symptoms: no chest pain, no chills, no cough, no dysuria, no fever, no hematuria, no shortness of breath and no sore throat        Prior to Admission medications   Medication Sig Start Date End Date Taking? Authorizing Provider  ezetimibe (ZETIA) 10 MG tablet Take 10 mg by mouth daily. 12/21/23  Yes [provider]  pantoprazole (PROTONIX) 40 MG tablet Take 40 mg by mouth daily. 01/13/24  Yes [provider]  amoxicillin  (AMOXIL ) 500 MG capsule Take 2 capsules (1,000 mg total) by mouth 2 (two) times daily. 09/11/15   Pisciotta, Peterson Brandt, PA-C  docusate sodium  (COLACE) 100 MG capsule Take 1 capsule (100 mg total) by mouth 2 (two) times daily. 09/30/12   Reggy Capers, MD  fluconazole  (DIFLUCAN ) 150 MG tablet Take 1 tablet (150 mg total) by mouth daily. Can repeat in 3 days if symptoms persist 09/11/15   Pisciotta, Peterson Brandt, PA-C  HYDROcodone -acetaminophen  (VICODIN) 5-500 MG per tablet Take 1 tablet by mouth every 4 (four) hours as needed for pain. 09/30/12   Reggy Capers, MD  ibuprofen  (ADVIL ,MOTRIN ) 600 MG tablet Take 1 tablet (600 mg total) by mouth every 6 (six) hours as needed for pain. 09/30/12   Reggy Capers, MD  Prenatal Vit-Fe Fumarate-FA (PRENATAL MULTIVITAMIN) TABS Take 1  tablet by mouth at bedtime.     [provider]    Allergies: Stadol  [butorphanol ] and Zithromax [azithromycin]    Review of Systems  Constitutional:  Negative for chills and fever.  HENT:  Negative for ear pain and sore throat.   Eyes:  Negative for pain and visual disturbance.  Respiratory:  Negative for cough and shortness of breath.   Cardiovascular:  Negative for chest pain and palpitations.  Gastrointestinal:  Positive for abdominal pain, nausea and vomiting.  Genitourinary:  Negative for dysuria and hematuria.  Musculoskeletal:  Negative for arthralgias and back pain.  Skin:  Negative for color change and rash.  Neurological:  Negative for seizures and syncope.  All other systems reviewed and are negative.   Updated Vital Signs BP (!) 125/90 (BP Location: Left Arm)   Pulse 93   Temp 97.6 F (36.4 C)   Resp (!) 24   SpO2 98%   Physical Exam Vitals and nursing note reviewed.  Constitutional:      General: She is not in acute distress.    Appearance: She is well-developed. She is not ill-appearing.  HENT:     Head: Normocephalic and atraumatic.   Eyes:     Conjunctiva/sclera: Conjunctivae normal.    Cardiovascular:     Rate and Rhythm: Normal rate and regular rhythm.     Heart sounds: No murmur heard. Pulmonary:     Effort: Pulmonary effort is  normal. No respiratory distress.     Breath sounds: Normal breath sounds.  Abdominal:     Palpations: Abdomen is soft.     Tenderness: There is abdominal tenderness in the epigastric area and left upper quadrant. There is no right CVA tenderness or left CVA tenderness.   Musculoskeletal:        General: No swelling.     Cervical back: Neck supple.   Skin:    General: Skin is warm and dry.     Capillary Refill: Capillary refill takes less than 2 seconds.   Neurological:     Mental Status: She is alert.   Psychiatric:        Mood and Affect: Mood normal.     (all labs ordered are listed, but only  abnormal results are displayed) Labs Reviewed  COMPREHENSIVE METABOLIC PANEL WITH GFR - Abnormal; Notable for the following components:      Result Value   Glucose, Bld 103 (*)    Calcium  10.5 (*)    All other components within normal limits  CBC - Abnormal; Notable for the following components:   WBC 18.7 (*)    Hemoglobin 15.4 (*)    All other components within normal limits  URINALYSIS, ROUTINE W REFLEX MICROSCOPIC - Abnormal; Notable for the following components:   Protein, ur TRACE (*)    All other components within normal limits  LIPASE, BLOOD  PREGNANCY, URINE    EKG: None  Radiology: No results found.   Procedures   Medications Ordered in the ED  sodium chloride  0.9 % bolus 500 mL (0 mLs Intravenous Stopped 01/24/24 1446)  ondansetron  (ZOFRAN ) injection 4 mg (4 mg Intravenous Given 01/24/24 1208)  HYDROmorphone (DILAUDID) injection 0.5 mg (0.5 mg Intravenous Given 01/24/24 1212)  sucralfate (CARAFATE) tablet 1 g (1 g Oral Given 01/24/24 1207)  alum & mag hydroxide-simeth (MAALOX/MYLANTA) 200-200-20 MG/5ML suspension 30 mL (30 mLs Oral Given 01/24/24 1207)                                    Medical Decision Making Medical Decision Making Nursing notes are reviewed. Differential diagnosis for this patient would include but not limited to: gastritis, GERD, pancreatitis, gallbladder disease, enteritis, other   Records reviewed: Outpatient records reviewed - patient seen on 12/21/23 for routine cervical cancer screening    Studies: CT abdomen/pelvis pending   Emergency Department Course:  Vital signs and pulse oximetry are reviewed, evaluated by myself and found to be within normal limits prior to final disposition. Findings of laboratory testing and medical imaging are discussed with patient and family that is available. Patient agrees with the medical care plan as follows:  Patient tearful and having epigastric pain palpation on exam.  She does have a  leukocytosis of 18 otherwise labs are fairly unremarkable.  I gave her Dilaudid as well as Zofran  IV fluids and a GI cocktail and Carafate.  I have concerns for gastritis.  CT scan is pending at this time. Patient signed out to oncoming provider at 3 PM pending remainder of workup and ultimate disposition.   Problems Addressed: Epigastric pain: undiagnosed new problem with uncertain prognosis  Amount and/or Complexity of Data Reviewed External Data Reviewed: notes. Labs: ordered. Decision-making details documented in ED Course. Radiology: ordered.  Risk OTC drugs. Prescription drug management. Parenteral controlled substances. Drug therapy requiring intensive monitoring for toxicity.     Final diagnoses:  Epigastric  pain    ED Discharge Orders     None          Kelle Pate, DO 01/24/24 1502

## 2024-01-24 NOTE — ED Notes (Signed)
 Patient transported to CT

## 2024-01-24 NOTE — Discharge Instructions (Signed)
 We are putting you on a blood thinner called Eliquis to help treat the blood clot affecting your spleen.  You may take Tylenol  for pain and we are also providing you hydrocodone  for breakthrough pain. Do NOT take Aspirin or NSAIDs (such as ibuprofen , advil , aleve, motrin , etc).  You will need to follow-up with hematology as well as vascular surgery.  The recommendation is for you to get a 1 month CT angiography of your abdomen and pelvis to follow-up for this blood clot.  If you develop worsening, continued, or recurrent abdominal pain, uncontrolled vomiting, fever, chest or back pain, or any other new/concerning symptoms then return to the ER for evaluation.

## 2024-01-24 NOTE — ED Triage Notes (Signed)
 C/o generalized abd pain that rads into left sided lower back started last night around 0300. Crying in triage. +n/v/d.

## 2024-01-24 NOTE — ED Provider Notes (Signed)
 Care transferred to me.  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  Discussed with Dr. Rosalva Comber, he is asking for a CTA.  His read of the CTA, he does not think this is an actual aneurysm but more clot and normal artery.  Either way he feels like anticoagulations fine as well as follow-up as an outpatient.  He recommends a 1 month CTA.  Also discussed with hematology, Dr. Rosaline Coma, given her pain is well-controlled it would be reasonable to treat her as an outpatient with Eliquis.  He will help arrange follow-up in a couple weeks in the hematology/oncology office.  Patient's pain is well-controlled so she will be treated with Tylenol , hydrocodone  for breakthrough pain, as well as starting on the Eliquis.  We counseled against aspirin and NSAIDs.  Will discharge home with return precautions.   Alexis Montenegro, MD 01/24/24 2010

## 2024-02-02 ENCOUNTER — Inpatient Hospital Stay (HOSPITAL_BASED_OUTPATIENT_CLINIC_OR_DEPARTMENT_OTHER): Admitting: Hematology and Oncology

## 2024-02-02 ENCOUNTER — Inpatient Hospital Stay: Attending: Hematology and Oncology

## 2024-02-02 VITALS — BP 141/89 | HR 92 | Temp 98.2°F | Resp 16 | Wt 158.4 lb

## 2024-02-02 DIAGNOSIS — D735 Infarction of spleen: Secondary | ICD-10-CM | POA: Diagnosis present

## 2024-02-02 DIAGNOSIS — F17210 Nicotine dependence, cigarettes, uncomplicated: Secondary | ICD-10-CM | POA: Insufficient documentation

## 2024-02-02 DIAGNOSIS — Z7901 Long term (current) use of anticoagulants: Secondary | ICD-10-CM | POA: Diagnosis not present

## 2024-02-02 LAB — CBC WITH DIFFERENTIAL (CANCER CENTER ONLY)
Abs Immature Granulocytes: 0.01 10*3/uL (ref 0.00–0.07)
Basophils Absolute: 0.1 10*3/uL (ref 0.0–0.1)
Basophils Relative: 1 %
Eosinophils Absolute: 0.1 10*3/uL (ref 0.0–0.5)
Eosinophils Relative: 1 %
HCT: 38.3 % (ref 36.0–46.0)
Hemoglobin: 13.5 g/dL (ref 12.0–15.0)
Immature Granulocytes: 0 %
Lymphocytes Relative: 33 %
Lymphs Abs: 3 10*3/uL (ref 0.7–4.0)
MCH: 32 pg (ref 26.0–34.0)
MCHC: 35.2 g/dL (ref 30.0–36.0)
MCV: 90.8 fL (ref 80.0–100.0)
Monocytes Absolute: 0.4 10*3/uL (ref 0.1–1.0)
Monocytes Relative: 4 %
Neutro Abs: 5.4 10*3/uL (ref 1.7–7.7)
Neutrophils Relative %: 61 %
Platelet Count: 278 10*3/uL (ref 150–400)
RBC: 4.22 MIL/uL (ref 3.87–5.11)
RDW: 12.2 % (ref 11.5–15.5)
WBC Count: 9 10*3/uL (ref 4.0–10.5)
nRBC: 0 % (ref 0.0–0.2)

## 2024-02-02 LAB — CMP (CANCER CENTER ONLY)
ALT: 14 U/L (ref 0–44)
AST: 12 U/L — ABNORMAL LOW (ref 15–41)
Albumin: 4.4 g/dL (ref 3.5–5.0)
Alkaline Phosphatase: 47 U/L (ref 38–126)
Anion gap: 6 (ref 5–15)
BUN: 10 mg/dL (ref 6–20)
CO2: 26 mmol/L (ref 22–32)
Calcium: 9.1 mg/dL (ref 8.9–10.3)
Chloride: 108 mmol/L (ref 98–111)
Creatinine: 0.64 mg/dL (ref 0.44–1.00)
GFR, Estimated: >60 mL/min (ref >60)
Glucose, Bld: 111 mg/dL — ABNORMAL HIGH (ref 70–99)
Potassium: 4 mmol/L (ref 3.5–5.1)
Sodium: 140 mmol/L (ref 135–145)
Total Bilirubin: 0.3 mg/dL (ref 0.0–1.2)
Total Protein: 7.3 g/dL (ref 6.5–8.1)

## 2024-02-02 LAB — C-REACTIVE PROTEIN: CRP: 1.2 mg/dL — ABNORMAL HIGH (ref <1.0)

## 2024-02-02 LAB — SEDIMENTATION RATE: Sed Rate: 17 mm/h (ref 0–22)

## 2024-02-02 NOTE — Progress Notes (Unsigned)
 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  Splenic and celiac arteries were patent 02/02/2024: establish care with Dr. Federico   CHIEF COMPLAINTS/PURPOSE OF CONSULTATION:  Splenic infarction  HISTORY OF PRESENTING ILLNESS:  Alexis Duarte 38 y.o. female with medical history significant for fetal demise at 34 weeks and headaches who presents for evaluation of a newly diagnosed splenic infarction.  On review of the previous records Ms. Alexis Duarte presented to the emergency department on 01/24/2024 with abdominal discomfort.  She underwent a CT scan of the abdomen which showed concern for a splenic infarct.  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  Splenic and celiac arteries were patent.  Due to concern for this finding the patient was referred to hematology for further evaluation and management.  On exam today Ms. Passe reports that she was having kidney pain for about 1 weeks time as well as some nausea and vomiting.  Due to this trouble she went to the emergency department.  She notes that having since started anticoagulation therapy her pain went from a 9 out of 10 down to about a 2 or 3 out of 10.  She reports that she is still uncomfortable but is able to work.  She currently works Education officer, environmental houses.  She is nervous about the fact that her white blood cells are elevated as is her hemoglobin.  She does smoke however, 1.5 packs/day.  She reports that she has no prior history of  blood clots.  She notes that she also has been having issue with psychiatric care and recently stopped many of her medications because she felt like she was being prescribed for too many.  She notes that she has not recently had COVID or the COVID-vaccine.  She has had no other recent major illnesses, surgeries, or prolonged travel.  On further discussion she reports that her mother passed away of esophageal cancer as did her father.  Her paternal grandmother had breast cancer which metastasized to the bone.  She reports she has 2 children and unfortunately a stillborn at 48 weeks.  She reports that she is currently trying to be seen by a gastroenterologist due to concern for her strong family history of esophageal cancer and also for a prior suicide attempt in which she drank a gallon of bleach.  She notes that she is actively smoking 1.5 packs/day but does not currently drink any alcohol.  She is still actively cleaning houses.  She notes that she is tolerating Eliquis  well so far with no bleeding, bruising, or dark stools.  Full 10 point ROS is otherwise negative.  MEDICAL HISTORY:  Past Medical History:  Diagnosis Date   Complication of anesthesia 07/13/12   nerve tweaked 2 post epid cath   Fetal demise    Headache(784.0)     SURGICAL HISTORY: Past Surgical History:  Procedure Laterality Date   stitches to head as a child      SOCIAL HISTORY: Social History   Socioeconomic History   Marital status: Legally Separated    Spouse name: Not on file  Number of children: Not on file   Years of education: Not on file   Highest education level: Not on file  Occupational History   Not on file  Tobacco Use   Smoking status: Every Day    Current packs/day: 0.50    Average packs/day: 0.5 packs/day for 4.0 years (2.0 ttl pk-yrs)    Types: Cigarettes   Smokeless tobacco: Not on file  Substance and Sexual Activity   Alcohol use: No   Drug use: No   Sexual activity: Yes  Other Topics  Concern   Not on file  Social History Narrative   Not on file   Social Drivers of Health   Financial Resource Strain: Low Risk  (12/21/2023)   Received [facility]   Overall Financial Resource Strain (CARDIA)    Difficulty of Paying Living Expenses: Not very hard  Food Insecurity: Food Insecurity Present (02/02/2024)   Hunger Vital Sign    Worried About Running Out of Food in the Last Year: Never true    Ran Out of Food in the Last Year: Sometimes true  Transportation Needs: No Transportation Needs (02/02/2024)   PRAPARE - Administrator, Civil Service (Medical): No    Lack of Transportation (Non-Medical): No  Physical Activity: Sufficiently Active (06/22/2023)   [facility]   Exercise Vital Sign    On average, how many days per week do you engage in moderate to strenuous exercise (like a brisk walk)?: 3 days    On average, how many minutes do you engage in exercise at this level?: 60 min  Stress: Stress Concern Present (06/22/2023)   Received from [facility] - Occupational Stress Questionnaire    Feeling of Stress : Rather much  Social Connections: Moderately Integrated (06/22/2023)   Received from [facility]   Social Network    How would you rate your social network (family, work, friends)?: Adequate participation with social networks  Intimate Partner Violence: Not At Risk (02/02/2024)   Humiliation, Afraid, Rape, and Kick questionnaire    Fear of Current or Ex-Partner: No    Emotionally Abused: No    Physically Abused: No    Sexually Abused: No    FAMILY HISTORY: Family History  Problem Relation Age of Onset   Hypertension Mother    Diabetes Mother    Heart disease Mother    Cancer Father    Diabetes Father    Hypertension Father     ALLERGIES:  is allergic to stadol  [butorphanol ] and zithromax [azithromycin].  MEDICATIONS:  Current Outpatient Medications  Medication Sig Dispense  Refill   albuterol (VENTOLIN HFA) 108 (90 Base) MCG/ACT inhaler Inhale 2 puffs into the lungs every 6 (six) hours as needed.     APIXABAN  (ELIQUIS ) VTE STARTER PACK (10MG  AND 5MG ) Take as directed on package: start with two-5mg  tablets twice daily for 7 days. On day 8, switch to one-5mg  tablet twice daily. 74 each 0   ezetimibe (ZETIA) 10 MG tablet Take 10 mg by mouth daily.     HYDROcodone -acetaminophen  (NORCO/VICODIN) 5-325 MG tablet Take 1 tablet by mouth every 4 (four) hours as needed. 15 tablet 0   pantoprazole (PROTONIX) 40 MG tablet Take 40 mg by mouth daily.     No current facility-administered medications for this visit.    REVIEW OF SYSTEMS:   Constitutional: ( - ) fevers, ( - )  chills , ( - ) night sweats Eyes: ( - )  blurriness of vision, ( - ) double vision, ( - ) watery eyes Ears, nose, mouth, throat, and face: ( - ) mucositis, ( - ) sore throat Respiratory: ( - ) cough, ( - ) dyspnea, ( - ) wheezes Cardiovascular: ( - ) palpitation, ( - ) chest discomfort, ( - ) lower extremity swelling Gastrointestinal:  ( - ) nausea, ( - ) heartburn, ( - ) change in bowel habits Skin: ( - ) abnormal skin rashes Lymphatics: ( - ) new lymphadenopathy, ( - ) easy bruising Neurological: ( - ) numbness, ( - ) tingling, ( - ) new weaknesses Behavioral/Psych: ( - ) mood change, ( - ) new changes  All other systems were reviewed with the patient and are negative.  PHYSICAL EXAMINATION:  Vitals:   02/02/24 1343  BP: (!) 141/89  Pulse: 92  Resp: 16  Temp: 98.2 F (36.8 C)  SpO2: 99%   Filed Weights   02/02/24 1343  Weight: 158 lb 6.4 oz (71.8 kg)    GENERAL: well appearing middle-age, Caucasian female in NAD  SKIN: skin color, texture, turgor are normal, no rashes or significant lesions EYES: conjunctiva are pink and non-injected, sclera clear LUNGS: clear to auscultation and percussion with normal breathing effort HEART: regular rate & rhythm and no murmurs and no lower extremity  edema Musculoskeletal: no cyanosis of digits and no clubbing  PSYCH: alert & oriented x 3, fluent speech NEURO: no focal motor/sensory deficits  LABORATORY DATA:  I have reviewed the data as listed    Latest Ref Rng & Units 02/02/2024    2:44 PM 01/24/2024   12:02 PM 09/29/2012    5:36 AM  CBC  WBC 4.0 - 10.5 K/uL 9.0  18.7  13.9   Hemoglobin 12.0 - 15.0 g/dL 86.4  84.5  89.5   Hematocrit 36.0 - 46.0 % 38.3  44.4  30.6   Platelets 150 - 400 K/uL 278  256  159        Latest Ref Rng & Units 02/02/2024    2:44 PM 01/24/2024   12:02 PM 07/13/2012    4:55 PM  CMP  Glucose 70 - 99 mg/dL 888  896  87   BUN 6 - 20 mg/dL 10  10  8    Creatinine 0.44 - 1.00 mg/dL 9.35  9.33  9.55   Sodium 135 - 145 mmol/L 140  137  134   Potassium 3.5 - 5.1 mmol/L 4.0  4.2  3.3   Chloride 98 - 111 mmol/L 108  101  100   CO2 22 - 32 mmol/L 26  22  22    Calcium  8.9 - 10.3 mg/dL 9.1  89.4  9.8   Total Protein 6.5 - 8.1 g/dL 7.3  7.8  7.0   Total Bilirubin 0.0 - 1.2 mg/dL 0.3  0.4  0.3   Alkaline Phos 38 - 126 U/L 47  49  56   AST 15 - 41 U/L 12  18  11    ALT 0 - 44 U/L 14  19  9       ASSESSMENT & PLAN Alexis Duarte 38 y.o. female with medical history significant for fetal demise at 38 weeks and headaches who presents for evaluation of a newly diagnosed splenic infarction.  After review of the labs, review of the records, and discussion with the patient the patients findings are most consistent with a splenic infarct due to splenic artery thrombosis, unclear etiology.  The patient was evaluated by vascular surgery  who recommended a hematological evaluation.  A provoked venous thromboembolism (VTE) is one that has a clear inciting factor or event. Provoking factors include prolonged travel/immobility, surgery (particular abdominal or orthropedic), trauma,  and pregnancy/ estrogen containing birth control. After a detailed history and review of the records there is no clear provoking factor for this patient's  VTE.  Patients with unprovoked VTEs have up to 25% recurrence after 5 years and 36% at 10 years, with 4% of these clots being fatal (BMJ?2019;366:l4363). Therefore the formal recommendation for unprovoked VTE's is lifelong anticoagulation, as the cause may not be transient or reversible. We recommend 6 months or full strength anticoagulation with a re-evaluation after that time.  The patient's will then have a choice of maintenance dose DOAC (preferred, recommended), 81mg  ASA PO daily (non-preferred), or no further anticoagulation (not recommended).    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  Lupus anticoagulant panel would be altered by presence of blood thinner, will hold on this testing.   --Due to the unusual nature of this clot we will order a full hypercoagulation panel to include JAK2 and PNH --recommend the patient continue eliquis  5mg  BID PO  --patient denies any bleeding, bruising, or dark stools on this medication. It is well tolerated. No difficulties accessing/affording the medication  --RTC in 6 months' time with strict return precautions for overt signs of bleeding.    Orders Placed This Encounter  Procedures   CBC with Differential (Cancer Center Only)    Standing Status:   Future    Number of Occurrences:   1    Expiration Date:   02/01/2025   CMP (Cancer Center only)    Standing Status:   Future    Number of Occurrences:   1    Expiration Date:   02/01/2025   Erythropoietin    Standing Status:   Future    Number of Occurrences:   1    Expiration Date:   02/01/2025   Sedimentation rate    Standing Status:   Future    Number of Occurrences:   1    Expiration Date:   02/01/2025   C-reactive protein    Standing Status:   Future    Number of  Occurrences:   1    Expiration Date:   02/01/2025   Antithrombin III   Protein C activity   Protein C, total   Protein S activity   Protein S, total   Beta-2-glycoprotein i abs, IgG/M/A   Homocysteine, serum   Factor 5 leiden   Prothrombin gene mutation   Cardiolipin antibodies, IgG, IgM, IgA   PNH Profile (High Sensitivity)    Standing Status:   Future    Number of Occurrences:   1    Expiration Date:   02/01/2025   NGS JAK2 Exons 12-15    Standing Status:   Future    Number of Occurrences:   1    Expiration Date:   02/01/2025    All questions were answered. The patient knows to call the clinic with any problems, questions or concerns.  A total of more than 60 minutes were spent on this encounter with face-to-face time and non-face-to-face time, including preparing to see the patient, ordering tests and/or medications, counseling the patient and coordination  of care as outlined above.   Norleen IVAR Kidney, MD Department of Hematology/Oncology [facility] Phone: [phone] Pager: [phone] Email: [email or web address]  02/04/2024 2:24 PM

## 2024-02-03 LAB — BETA-2-GLYCOPROTEIN I ABS, IGG/M/A
Beta-2 Glyco I IgG: <9 GPI IgG units (ref 0–20)
Beta-2-Glycoprotein I IgA: <9 GPI IgA units (ref 0–25)
Beta-2-Glycoprotein I IgM: <9 GPI IgM units (ref 0–32)

## 2024-02-03 LAB — ERYTHROPOIETIN: Erythropoietin: 18 m[IU]/mL (ref 2.6–18.5)

## 2024-02-04 LAB — PROTEIN S, TOTAL: Protein S Ag, Total: 96 % (ref 60–150)

## 2024-02-04 LAB — PROTEIN C ACTIVITY: Protein C Activity: 130 % (ref 73–180)

## 2024-02-04 LAB — PROTEIN C, TOTAL: Protein C, Total: 103 % (ref 60–150)

## 2024-02-04 LAB — PROTEIN S ACTIVITY: Protein S Activity: 112 % (ref 63–140)

## 2024-02-05 LAB — CARDIOLIPIN ANTIBODIES, IGG, IGM, IGA
Anticardiolipin IgA: <9 U/mL (ref 0–11)
Anticardiolipin IgG: <9 GPL U/mL (ref 0–14)
Anticardiolipin IgM: <9 [MPL'U]/mL (ref 0–12)

## 2024-02-05 LAB — HOMOCYSTEINE: Homocysteine: 7.6 umol/L (ref 0.0–14.5)

## 2024-02-06 LAB — FACTOR 5 LEIDEN

## 2024-02-06 LAB — PNH PROFILE (-HIGH SENSITIVITY)

## 2024-02-07 ENCOUNTER — Other Ambulatory Visit: Payer: Self-pay | Admitting: *Deleted

## 2024-02-07 DIAGNOSIS — D735 Infarction of spleen: Secondary | ICD-10-CM

## 2024-02-07 LAB — MISC LABCORP TEST (SEND OUT): Labcorp test code: 15040

## 2024-02-08 LAB — PROTHROMBIN GENE MUTATION

## 2024-02-11 LAB — NGS JAK2 EXONS 12-15

## 2024-02-12 ENCOUNTER — Telehealth: Payer: Self-pay | Admitting: Hematology and Oncology

## 2024-02-12 ENCOUNTER — Ambulatory Visit: Payer: Self-pay | Admitting: *Deleted

## 2024-02-12 NOTE — Telephone Encounter (Signed)
 TCT patient regarding recent lab results. Spoke with her. Advised that her white blood cell count has normalized. Dr. Federico suspects that she had a high white blood cell count emergency department due to inflammation from the fresh splenic  clot.  Now that she is on blood thinner it appears the inflammation has cooled down and her numbers have normalized.  We are still waiting on several of her coagulation test to return, but so far we  have not found evidence of a coagulation disorder.  We will call her back once the final results are available. Pt voiced understanding She is aware of her appt in 6 months

## 2024-02-12 NOTE — Telephone Encounter (Signed)
 Scheduled appointments per 6/27 los. Talked with the patient and she is aware of the patient appointments.

## 2024-02-12 NOTE — Telephone Encounter (Signed)
-----   Message from Norleen ONEIDA Kidney IV sent at 02/04/2024  2:23 PM EDT ----- Please reassure Mrs. Lafavor that her white blood cell count has normalized.  I do suspect that she had a high white blood cell count emergency department due to inflammation from the fresh splenic  clot.  Now that she is on blood thinner it appears the inflammation has cooled down and her numbers have normalized.  We are still waiting on several of her coagulation test to return, but so far we  have not found evidence of a coagulation disorder.  We will call her back once the final results are available. ----- Message ----- From: Interface, Lab In [place] Sent: 02/02/2024   2:49 PM EDT To: Norleen ONEIDA Kidney [place], MD

## 2024-02-29 ENCOUNTER — Ambulatory Visit: Admitting: Vascular Surgery

## 2024-08-04 ENCOUNTER — Other Ambulatory Visit: Payer: Self-pay | Admitting: Hematology and Oncology

## 2024-08-04 DIAGNOSIS — D735 Infarction of spleen: Secondary | ICD-10-CM

## 2024-08-04 NOTE — Progress Notes (Unsigned)
 No show

## 2024-08-05 ENCOUNTER — Other Ambulatory Visit

## 2024-08-05 ENCOUNTER — Inpatient Hospital Stay: Admitting: Hematology and Oncology

## 2024-08-05 DIAGNOSIS — D735 Infarction of spleen: Secondary | ICD-10-CM
